# Patient Record
Sex: Male | Born: 1950 | Race: White | Hispanic: No | Marital: Married | Smoking: Current every day smoker
Health system: Southern US, Community
[De-identification: ages and names within clinical notes are randomized; demographics above are authoritative.]

## PROBLEM LIST (undated history)

## (undated) DIAGNOSIS — I10 Essential (primary) hypertension: Secondary | ICD-10-CM

## (undated) DIAGNOSIS — E7800 Pure hypercholesterolemia, unspecified: Secondary | ICD-10-CM

## (undated) DIAGNOSIS — R0600 Dyspnea, unspecified: Secondary | ICD-10-CM

## (undated) DIAGNOSIS — C4359 Malignant melanoma of other part of trunk: Secondary | ICD-10-CM

## (undated) DIAGNOSIS — R0609 Other forms of dyspnea: Secondary | ICD-10-CM

## (undated) DIAGNOSIS — E039 Hypothyroidism, unspecified: Secondary | ICD-10-CM

## (undated) DIAGNOSIS — N189 Chronic kidney disease, unspecified: Secondary | ICD-10-CM

## (undated) DIAGNOSIS — R112 Nausea with vomiting, unspecified: Secondary | ICD-10-CM

## (undated) DIAGNOSIS — Z9889 Other specified postprocedural states: Secondary | ICD-10-CM

## (undated) HISTORY — DX: Dyspnea, unspecified: R06.00

## (undated) HISTORY — DX: Other forms of dyspnea: R06.09

## (undated) HISTORY — PX: CERVICAL DISC SURGERY: SHX588

## (undated) HISTORY — DX: Essential (primary) hypertension: I10

## (undated) HISTORY — DX: Pure hypercholesterolemia, unspecified: E78.00

---

## 2001-10-29 ENCOUNTER — Emergency Department (HOSPITAL_COMMUNITY): Admission: EM | Admit: 2001-10-29 | Discharge: 2001-10-29 | Payer: Self-pay | Admitting: Emergency Medicine

## 2007-10-04 ENCOUNTER — Ambulatory Visit: Payer: Self-pay | Admitting: Vascular Surgery

## 2009-05-24 ENCOUNTER — Ambulatory Visit: Payer: Self-pay | Admitting: Vascular Surgery

## 2009-08-29 ENCOUNTER — Ambulatory Visit: Payer: Self-pay | Admitting: Vascular Surgery

## 2010-05-02 ENCOUNTER — Encounter: Admission: RE | Admit: 2010-05-02 | Discharge: 2010-05-02 | Payer: Self-pay | Admitting: Neurosurgery

## 2010-05-02 IMAGING — CT CT CERVICAL SPINE W/O CM
4 series · 16 of 33 positions shown, 19 images · non-contrast
Comparison: None

CLINICAL DATA: Neck pain.  Right arm pain and numbness.  Prior
cervical surgery.

CT CERVICAL SPINE WITHOUT CONTRAST
TECHNIQUE: Multidetector CT imaging of the cervical spine was
performed. Multiplanar CT image reconstructions were also
generated.

[Series 2: c spine bone · axial · 0.27mm/px · z∈[-59,+81]mm · 5 of 84 slices shown, 7 images]
[im 14/84  soft-tissue]
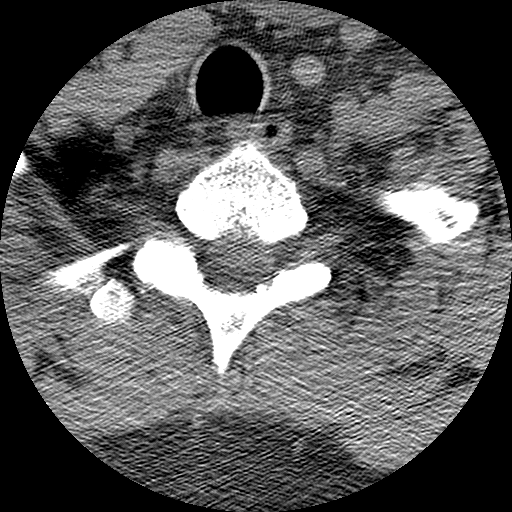
[im 14/84  bone]
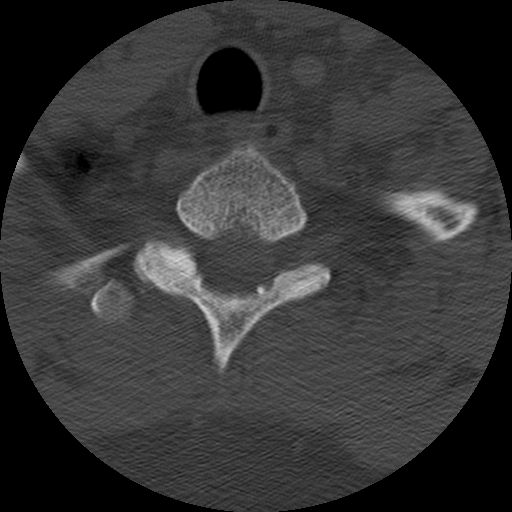
[im 28/84  bone]
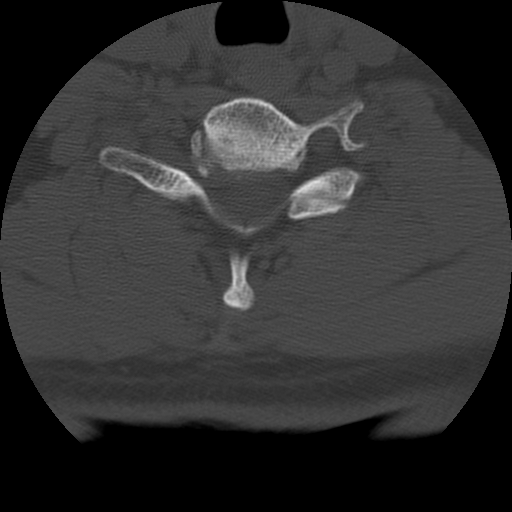
[im 42/84  bone]
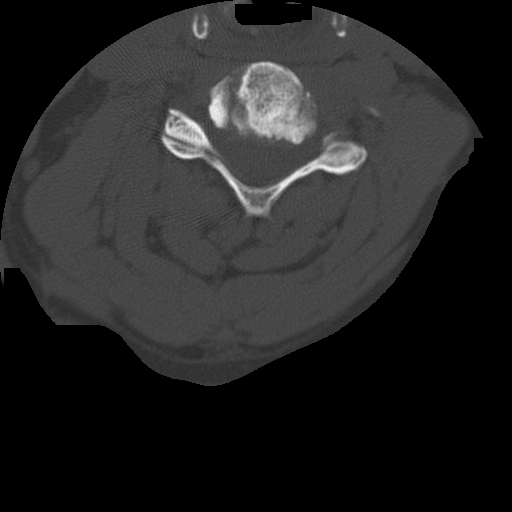
[im 56/84  bone]
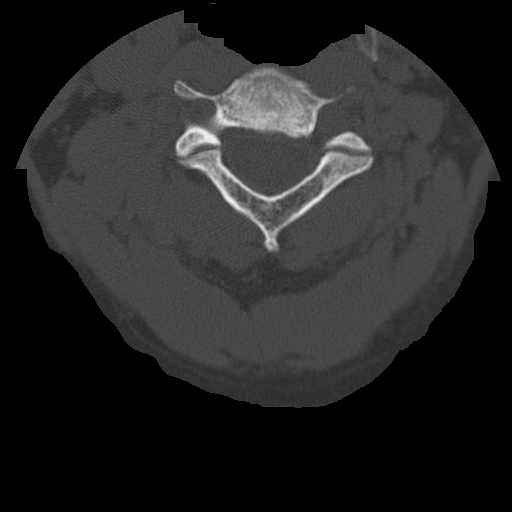
[im 70/84  soft-tissue]
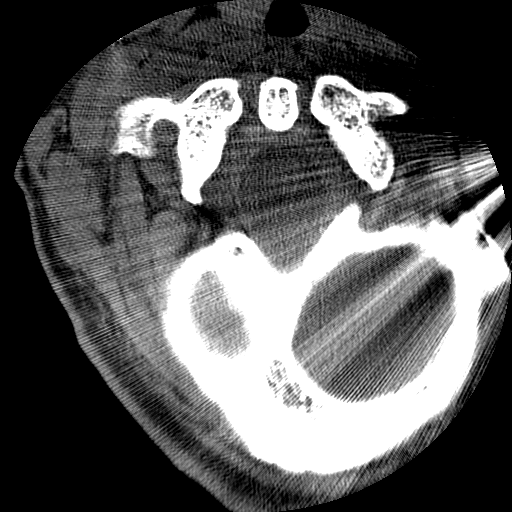
[im 70/84  bone]
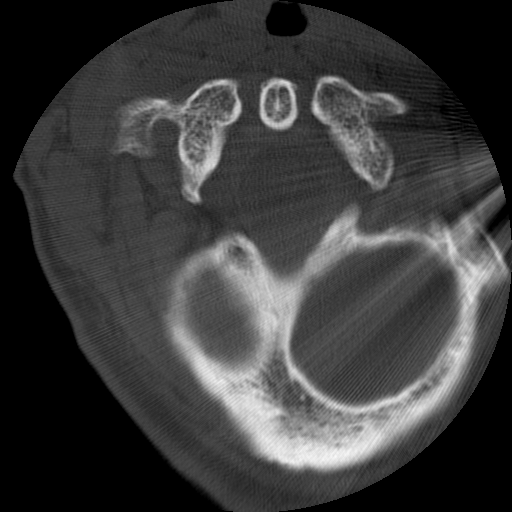

[Series 3: c spine soft · axial · 0.27mm/px · z∈[-59,+11]mm · 3 of 84 slices shown]
[im 14/84  soft-tissue]
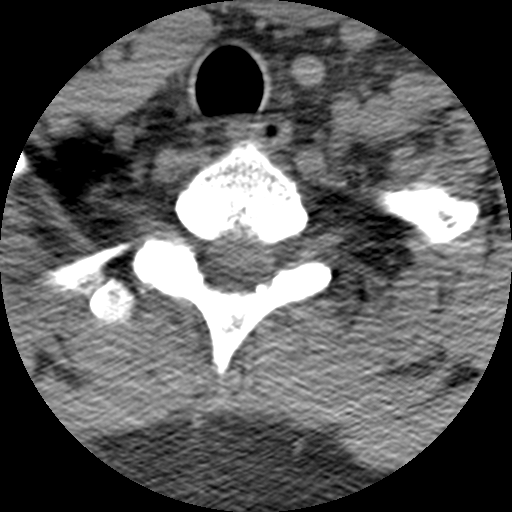
[im 28/84  soft-tissue]
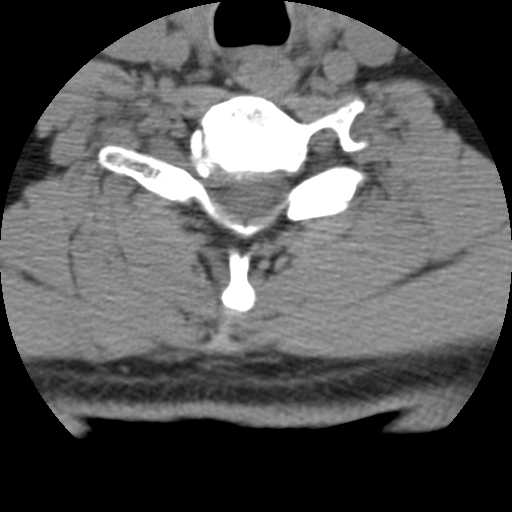
[im 42/84  soft-tissue]
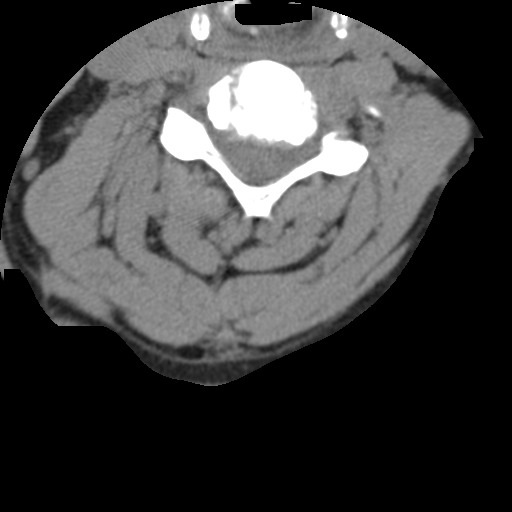

[Series 400: coronal · coronal · 0.39mm/px · 3 of 50 slices shown]
[im 10/50  bone]
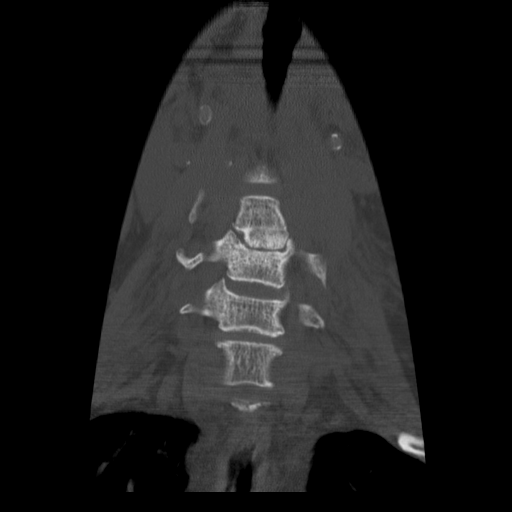
[im 20/50  bone]
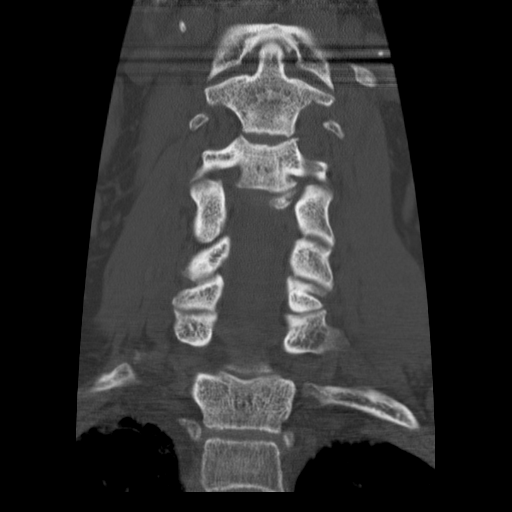
[im 30/50  bone]
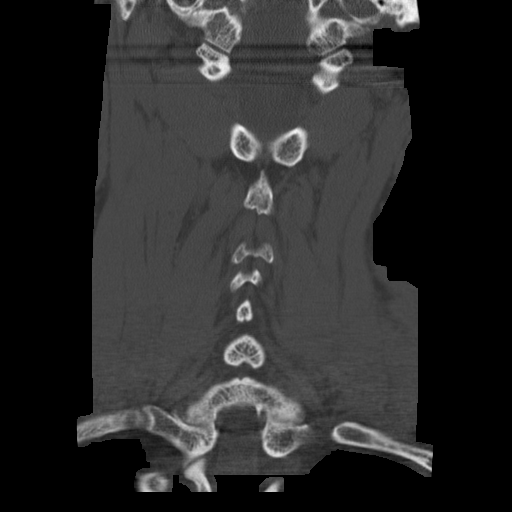

[Series 401: sagittal · sagittal · 0.39mm/px · 5 of 50 slices shown, 6 images]
[im 17/50  bone]
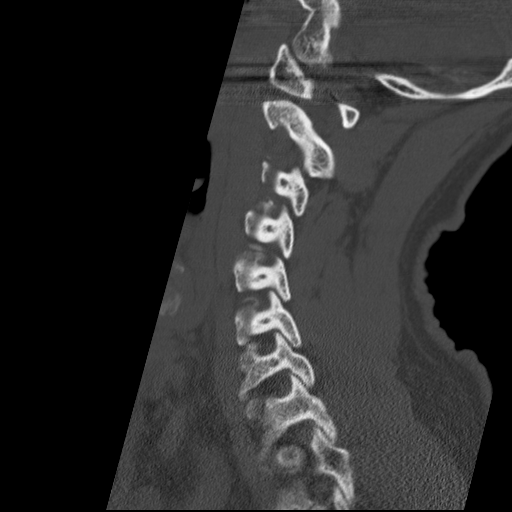
[im 21/50  bone]
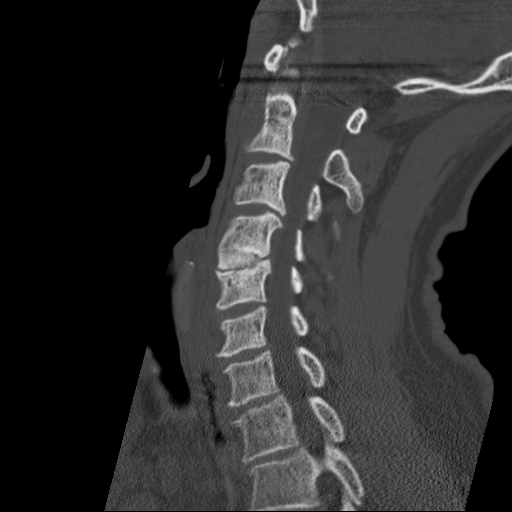
[im 25/50  soft-tissue]
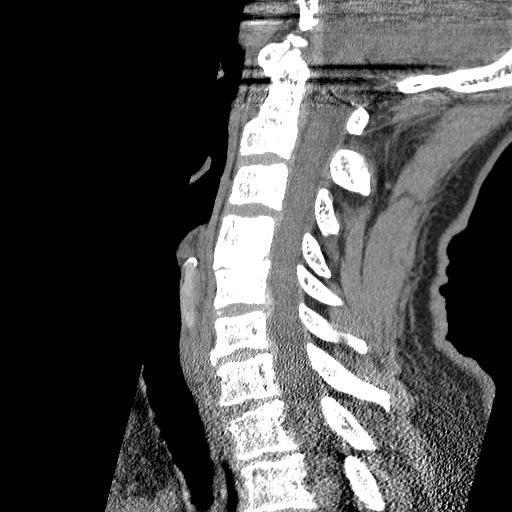
[im 25/50  bone]
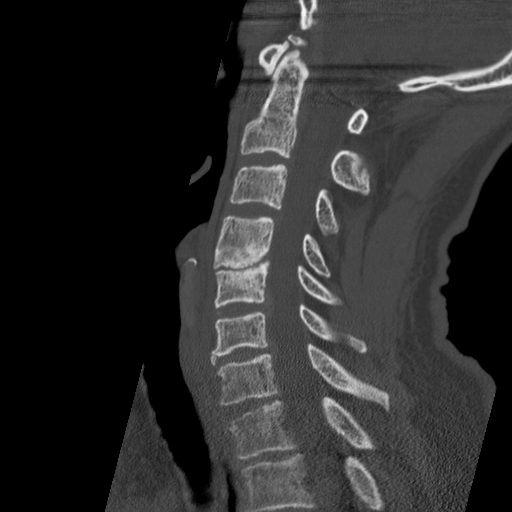
[im 29/50  bone]
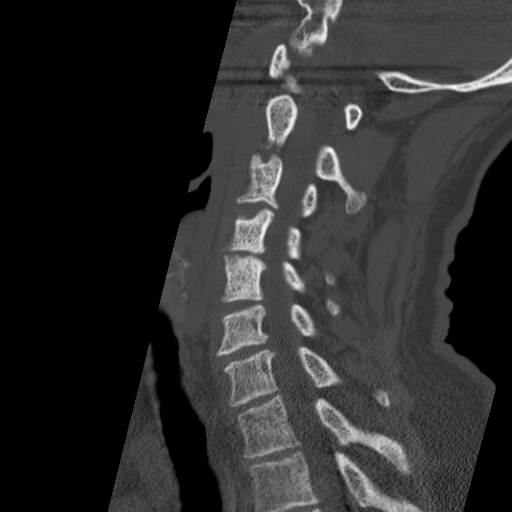
[im 33/50  bone]
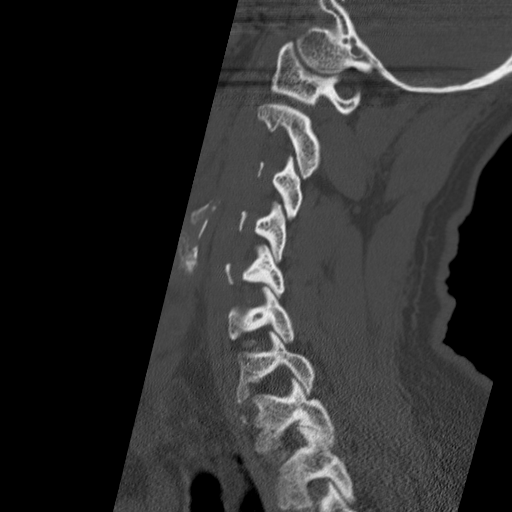

[16 of 33 positions shown; findings below may reference images not displayed]

FINDINGS: Prior attempted   anterior fusion at C4-5 without
hardware.  Interbody bone graft is in place with pseudoarthrosis
through the disc space.  Negative for fracture or mass.  No acute
bony abnormality.

C2-3:  Mild disc degeneration and spurring on the left with mild
left foraminal encroachment.

C3-4:  Disc degeneration and spondylosis.  Uncovertebral spurring,
left greater than right causing moderate left foraminal
encroachment and mild right foraminal encroachment.  Mild spinal
stenosis due to spurring.

C4-5:  Attempted interbody fusion with pseudoarthrosis.  There is
uncovertebral spurring   contributing to mild to moderate foraminal
encroachment bilaterally and mild spinal stenosis.

C5-6:  Central disc protrusion.  Mild uncovertebral spurring with
mild foraminal narrowing bilaterally.

C6-7:  Small central disc protrusion.  Disc degeneration and
spondylosis with spurring, right greater than left.  There is right
foraminal encroachment.

C7-T1:  Negative
IMPRESSION: Prior attempted fusion at C4-5 with pseudoarthrosis.  There is
spondylitic change at this level causing mild spinal stenosis and
foraminal encroachment, left greater than right.

Mild left foraminal narrowing at C2-3 and moderate left foraminal
narrowing at C3-4 due to spurring.

Central disc protrusion and spinal stenosis at C5-6 with associated
spondylosis.

Right foraminal narrowing at C6-7 due to spondylosis.

## 2010-07-17 LAB — CBC
HCT: 40.1 % (ref 39.0–52.0)
Hemoglobin: 13.2 g/dL (ref 13.0–17.0)
MCH: 32.5 pg (ref 26.0–34.0)
MCHC: 32.9 g/dL (ref 30.0–36.0)
MCV: 98.8 fL (ref 78.0–100.0)
Platelets: 202 10*3/uL (ref 150–400)
RBC: 4.06 MIL/uL — ABNORMAL LOW (ref 4.22–5.81)
RDW: 15.1 % (ref 11.5–15.5)
WBC: 5 10*3/uL (ref 4.0–10.5)

## 2010-07-24 ENCOUNTER — Ambulatory Visit (HOSPITAL_COMMUNITY)
Admission: RE | Admit: 2010-07-24 | Discharge: 2010-07-25 | Payer: Self-pay | Source: Home / Self Care | Attending: Neurosurgery | Admitting: Neurosurgery

## 2010-08-07 LAB — SURGICAL PCR SCREEN
MRSA, PCR: NEGATIVE
Staphylococcus aureus: NEGATIVE

## 2010-11-25 NOTE — Procedures (Signed)
DUPLEX DEEP VENOUS EXAM - LOWER EXTREMITY   INDICATION:  Follow up known thrombus.   HISTORY:  Edema:  Yes.  Trauma/Surgery:  No.  Pain:  Yes, right leg.  PE:  No.  Previous DVT:  Right great saphenous vein.  Anticoagulants:  Yes.  Other:  No.   DUPLEX EXAM:                CFV   SFV   PopV  PTV    GSV                R  L  R  L  R  L  R   L  R  L  Thrombosis    o  o  o     o     o      +  Spontaneous   +  +  +     +     +      0  Phasic        +  +  +     +     +      0  Augmentation  +  +  +     +     +      0  Compressible  +  +  +     +     +      0  Competent     +  +  +     +     +      0   Legend:  + - yes  o - no  p - partial  D - decreased   IMPRESSION:  1. The right leg appears free of deep venous thrombus.  2. 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  There is no evidence of      thrombus at the saphenofemoral junction.    _____________________________  Di Kindle. Edilia Bo, M.D.   CB/MEDQ  D:  08/30/2009  T:  08/30/2009  Job:  [id number]

## 2010-11-25 NOTE — Procedures (Signed)
DUPLEX DEEP VENOUS EXAM - LOWER EXTREMITY   INDICATION:  Right thigh pain for several months.   HISTORY:  Edema:  Right calf for 6 months which is intermittent.  Trauma/Surgery:  No.  Pain:  Right thigh.  PE:  No.  Previous DVT:  No.  Patient has a history of right greater saphenous  vein thrombosis in 2007.  Anticoagulants:  No.  Other:   DUPLEX EXAM:                CFV   SFV   PopV  PTV    GSV                R  L  R  L  R  L  R   L  R  L  Thrombosis    0  0  0     0     0      0  Spontaneous   +  +  +     +     +      +  Phasic        +  +  +     +     +      +  Augmentation  +  +  +     +     +      +  Compressible  +  +  +     +     +      +  Competent     +  +  +     +     +      0   Legend:  + - yes  o - no  p - partial  D - decreased   IMPRESSION:  1. No evidence of right leg DVT.  2. Right greater saphenous vein is patent but incompetent, which is      unchanged from 2007.  3. Right posterior tibial artery signal was within normal limits.    _____________________________  Di Kindle. Edilia Bo, M.D.   DP/MEDQ  D:  10/04/2007  T:  10/04/2007  Job:  [id number]

## 2010-11-25 NOTE — Assessment & Plan Note (Signed)
OFFICE VISIT   Bradley [name], Bradley [name]  DOB:  [birth year]                                       05/24/2009  NWGNF#:[id number]   Patient presents today for evaluation of leg pain.  He in the past had  seen Dr. Cari Caraway for superficial thrombophlebitis from his knee  distally.  He had then subsequently undergone noninvasive study in our  office in March 2009 showing resolution of the clot in the saphenous  vein but that he did have incompetence in the saphenous vein.   He presents today with complaints of pain over the past 2 weeks and  initially some erythema in his right groin, which is resolved.  He does  have some chronic swelling in his lower extremities.  Health is  otherwise quite good with no major medical difficulties.   He underwent noninvasive vascular laboratory studies in our office, and  this did not show any evidence of deep venous thrombosis.  He did have a  clot in his saphenous vein up to the saphenofemoral junction.  I  discussed this at length with patient.  I explained that with a  propagation of clot that I would recommend Coumadin therapy.  I  discussed this with Dr. Jeanmarie Hubert as well, who will manage the  Coumadin.  We did discuss the option of subcu Lovenox but with him  having no DVT, I feel that it is appropriate to start him on Coumadin  without a Lovenox bridge.  Plan to see him again in 3 months with repeat  duplex at that time   Larina Earthly, M.D.  Electronically Signed   TFE/MEDQ  D:  05/24/2009  T:  05/27/2009  Job:  [id number]

## 2010-11-25 NOTE — Assessment & Plan Note (Signed)
OFFICE VISIT   Bradley, [name]  DOB:  18-Feb-1951                                       10/04/2007  NGEXB#:[id number]   HISTORY:  I saw the patient today in the office today concerning some  pain in his right thigh.  He has a history of superficial  thrombophlebitis.  This is a pleasant 60 year old gentleman who I had  seen back in August of 2003.  Over the last 4-5 months he has had some  pain in his right thigh and he was concerned he might have a blood clot.  He does have a history of superficial thrombophlebitis in this area but  no history of DVT.  He states that standing aggravates his symptoms  somewhat.  Elevation relieves his symptoms somewhat.  Over the last 4-5  months his symptoms have remained relatively stable.  His only other  complaint has been some occasional pain in his calf with ambulation and  also some swelling in his right ankle.  He has had no history of rest  pain, no history of nonhealing ulcers and no symptoms in the left leg.   PAST MEDICAL HISTORY:  His past medical history is fairly unremarkable.  He denies any history of diabetes, hypertension, hypercholesterolemia,  history of previous myocardial infarction or history of congestive heart  failure.   FAMILY HISTORY:  There is no history of premature cardiovascular  disease.   SOCIAL HISTORY:  He is married and he has 2 children.  He smokes a pack  per day of cigarettes and has been smoking for 40 years.   REVIEW OF SYSTEMS:  Documented on the medical history form in his chart.   MEDICATIONS:  He is currently on no  medications.   PHYSICAL EXAMINATION:  On physical examination this is a pleasant 24-  year-old gentleman who appears his stated age.  Blood pressure is  126/85, heart rate 71.  I do not detect any carotid bruits.  Lungs are  clear bilaterally to auscultation.  On cardiac exam he has a regular  rate and rhythm.  Abdomen is soft and nontender.  He has palpable  femoral, popliteal and pedis pulses bilaterally.  He has some  hyperpigmentation bilaterally and some small varicose veins.  He has no  evidence of active phlebitis on exam and no venous ulcers or ischemic  ulcers.   He did have a venous duplex scan in our office today which shows no  evidence of DVT on the right side and no evidence of superficial  thrombophlebitis.  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   I reassured him he had no evidence of DVT.  We have discussed the  importance of intermittent leg elevation.  He is not an ideal candidate  for compression stockings given the location of his symptoms in the  thigh and he is fairly active and does not want to wear compression  stockings.  I will be happy to see him back if any new vascular issues  arise.  He understands this is a chronic problem but not serious.   Di Kindle. Edilia Bo, M.D.  Electronically Signed   CSD/MEDQ  D:  10/04/2007  T:  10/05/2007  Job:  [id number]

## 2010-11-25 NOTE — Procedures (Signed)
DUPLEX DEEP VENOUS EXAM - LOWER EXTREMITY   INDICATION:  Right lower extremity began approximately 2 weeks ago, has  improved some.   HISTORY:  Edema:  Possible minimal right lower extremity.  Trauma/Surgery:  Pain:  Right lower extremity.  PE:  No.  Previous DVT:  Superficial right greater saphenous vein thrombus.  Anticoagulants:  No.  Other:   DUPLEX EXAM:                CFV   SFV   PopV  PTV    GSV                R  L  R  L  R  L  R   L  R  L  Thrombosis    +  0  0     0     0      +  Spontaneous   +  +  +     +     +      0  Phasic        +  +  +     +     +      0  Augmentation  +  +  +     +     +      0  Compressible  P  +  +     +     +      0  Competent     +  +  +     +     +      0   Legend:  + - yes  o - no  p - partial  D - decreased   IMPRESSION:  1. No evidence of deep venous thrombosis in the right lower extremity      or left common femoral vein except for tail extending into common      femoral vein.  2. However, a totally occluded right greater saphenous vein with      thrombus extending in small tail (nonmobile) into the common      femoral vein.  3. Thrombosed varicosities noted in knee/calf area.    _____________________________  Di Kindle. Edilia Bo, M.D.   AS/MEDQ  D:  05/24/2009  T:  05/25/2009  Job:  [id number]

## 2013-11-29 ENCOUNTER — Telehealth: Payer: Self-pay

## 2013-11-29 DIAGNOSIS — I8390 Asymptomatic varicose veins of unspecified lower extremity: Secondary | ICD-10-CM

## 2013-11-29 DIAGNOSIS — M7989 Other specified soft tissue disorders: Secondary | ICD-10-CM

## 2013-11-29 DIAGNOSIS — I872 Venous insufficiency (chronic) (peripheral): Secondary | ICD-10-CM

## 2013-11-29 NOTE — Telephone Encounter (Addendum)
Phone call from pt.  Reported has had swelling in the right ankle with discoloration for approx. 6-8 mos.  Stated the right ankle is significantly more swollen compared to the left ankle.  Also reported has a large, palm-sized area of inner right leg, just above the ankle, that he has had a long time;  Reported this area has become more red, scaly, and itchy.  Denies any open sores/ drainage at this time.  Questioned about pain.  Pt. stated he has had intermittent aching in the right leg for several years; stated "I've grown accustomed to the throbbing sensation that I get in the right leg."  Denies rest pain. Pt. also reports bulging varicosities of post. right thigh; denies any pain in varicosities.  Stated he called today, due to wife becoming aware of the swelling, and encouraged him to call.  Discussed with Dr. Scot Dock.  Recommended to sched. a (R) LE Venous Reflux Study, and office exam.  Pt. will be notified of appts.

## 2013-11-30 NOTE — Telephone Encounter (Signed)
Spoke with patient to schedule. dpm

## 2013-12-26 ENCOUNTER — Encounter: Payer: Self-pay | Admitting: Vascular Surgery

## 2013-12-27 ENCOUNTER — Ambulatory Visit (INDEPENDENT_AMBULATORY_CARE_PROVIDER_SITE_OTHER): Payer: 59 | Admitting: Vascular Surgery

## 2013-12-27 ENCOUNTER — Encounter: Payer: Self-pay | Admitting: Vascular Surgery

## 2013-12-27 ENCOUNTER — Ambulatory Visit (HOSPITAL_COMMUNITY)
Admission: RE | Admit: 2013-12-27 | Discharge: 2013-12-27 | Disposition: A | Discharge status: Home / Self Care | Payer: 59 | Source: Ambulatory Visit | Attending: Vascular Surgery | Admitting: Vascular Surgery

## 2013-12-27 VITALS — BP 129/92 | HR 61 | Ht 72.0 in | Wt 237.0 lb

## 2013-12-27 DIAGNOSIS — I83893 Varicose veins of bilateral lower extremities with other complications: Secondary | ICD-10-CM | POA: Insufficient documentation

## 2013-12-27 DIAGNOSIS — I8390 Asymptomatic varicose veins of unspecified lower extremity: Secondary | ICD-10-CM | POA: Insufficient documentation

## 2013-12-27 DIAGNOSIS — M7989 Other specified soft tissue disorders: Secondary | ICD-10-CM | POA: Insufficient documentation

## 2013-12-27 DIAGNOSIS — I8310 Varicose veins of unspecified lower extremity with inflammation: Secondary | ICD-10-CM

## 2013-12-27 DIAGNOSIS — I872 Venous insufficiency (chronic) (peripheral): Secondary | ICD-10-CM | POA: Insufficient documentation

## 2013-12-27 NOTE — Assessment & Plan Note (Signed)
This patient has chronic venous insufficiency with reflux both in his deep system in the right lower extremity and also in the right greater saphenous vein. He has a rash along his medial malleolus but no open ulcer. We have discussed the importance of intermittent leg elevation and compression therapy. I've encouraged him to avoid prolonged sitting and standing. We discussed potentially doing water aerobics which I think is helpful for patients with venous disease. We also discussed considering a thigh high compression stocking with a 20-30 mm of mercury pressure gradient and if this was not successful been considering laser ablation of his right greater saphenous vein. Currently he is not interested in that option. We've also discussed the importance of tobacco cessation. Also him back as needed. I have written him a prescription for knee-high compression stockings with a gradient of 15-20 mm mercury.

## 2013-12-27 NOTE — Progress Notes (Signed)
Patient ID: Bradley [name], male   DOB: Jan 28, 1951, 63 y.o.   MRN: [id number]  Reason for Consult: New Evaluation   Referred by No ref. Majesta Leichter found  Subjective:     HPI:  Bradley [name] is a 62 y.o. male Has a history of a superficial clot in his right greater saphenous vein back in 2011. This was treated with Coumadin for approximately 3 months. He was then lost to follow up. He had no evidence of DVT at that time. He developed a rash along the medial aspect of his distal right leg and presents for venous evaluation. He denies any open ulcers. He does experience some pain and aching when he is standing for prolonged time. 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 He denies any bleeding episodes. He has had some leg swelling.  He smokes a pack per day of cigarettes for as long as he can remember.  History reviewed. No pertinent past medical history. Family History  Problem Relation Age of Onset  . Heart disease Father   . Diabetes Father   . Hyperlipidemia Father    Past Surgical History  Procedure Laterality Date  . Cervical disc surgery     Short Social History:  History  Substance Use Topics  . Smoking status: Current Every Day Smoker -- 1.00 packs/day for 50 years    Types: Cigarettes  . Smokeless tobacco: Not on file  . Alcohol Use: 10.2 - 12.6 oz/week    12-15 Cans of beer, 5-6 Shots of liquor per week   No Known Allergies  Current Outpatient Prescriptions  Medication Sig Dispense Refill  . atorvastatin (LIPITOR) 10 MG tablet Take 1 tablet by mouth daily.       No current facility-administered medications for this visit.    Review of Systems  Constitutional: Negative for chills and fever.  Eyes: Negative for loss of vision.  Respiratory: Negative for cough and wheezing.  Cardiovascular: Positive for leg swelling. Negative for chest pain, chest tightness, claudication, dyspnea with exertion, orthopnea and palpitations.  GI: Negative for blood in  stool and vomiting.  GU: Negative for dysuria and hematuria.  Musculoskeletal: Negative for leg pain, joint pain and myalgias.  Skin: Positive for rash. Negative for wound.  Neurological: Positive for numbness. Negative for dizziness and speech difficulty.  Hematologic: Negative for bruises/bleeds easily. Psychiatric: Negative for depressed mood.        Objective:  Objective  Filed Vitals:   12/27/13 0951  BP: 129/92  Pulse: 61  Height: 6' (1.829 m)  Weight: 237 lb (107.502 kg)  SpO2: 95%   Body mass index is 32.14 kg/(m^2).  Physical Exam  Constitutional: He is oriented to person, place, and time. He appears well-developed and well-nourished.  HENT:  Head: Normocephalic and atraumatic.  Neck: Neck supple. No JVD present. No thyromegaly present.  Cardiovascular: Normal rate, regular rhythm and normal heart sounds.  Exam reveals no friction rub.   No murmur heard. Pulses:      Dorsalis pedis pulses are 2+ on the right side, and 2+ on the left side.  Pulmonary/Chest: Breath sounds normal. He has no wheezes. He has no rales.  Abdominal: Soft. Bowel sounds are normal. There is no tenderness.  I do not palpate an aneurysm.  Musculoskeletal: Normal range of motion. He exhibits no edema.  Lymphadenopathy:    He has no cervical adenopathy.  Neurological: He is alert and oriented to person, place, and time. He  has normal strength. No sensory deficit.  Skin: Rash noted. No lesion noted.  Psychiatric: He has a normal mood and affect.   Data: I have independently interpreted his venous duplex scan today. There is no evidence of DVT. He does have reflux in his deep system on the right and also in the right greater saphenous vein.      Assessment/Plan:    Varicose veins of lower extremities with other complications This patient has chronic venous insufficiency with reflux both in his deep system in the right lower extremity and also in the right greater saphenous vein. He has a rash  along his medial malleolus but no open ulcer. We have discussed the importance of intermittent leg elevation and compression therapy. I've encouraged him to avoid prolonged sitting and standing. We discussed potentially doing water aerobics which I think is helpful for patients with venous disease. We also discussed considering a thigh high compression stocking with a 20-30 mm of mercury pressure gradient and if this was not successful been considering laser ablation of his right greater saphenous vein. Currently he is not interested in that option. We've also discussed the importance of tobacco cessation. Also him back as needed. I have written him a prescription for knee-high compression stockings with a gradient of 15-20 mm mercury.     Angelia Mould MD [facility]

## 2016-05-29 ENCOUNTER — Other Ambulatory Visit: Payer: Self-pay | Admitting: Family Medicine

## 2016-05-29 DIAGNOSIS — N289 Disorder of kidney and ureter, unspecified: Secondary | ICD-10-CM

## 2016-06-02 ENCOUNTER — Ambulatory Visit
Admission: RE | Admit: 2016-06-02 | Discharge: 2016-06-02 | Disposition: A | Discharge status: Home / Self Care | Payer: Commercial Managed Care - HMO | Source: Ambulatory Visit | Attending: Family Medicine | Admitting: Family Medicine

## 2016-06-02 DIAGNOSIS — N289 Disorder of kidney and ureter, unspecified: Secondary | ICD-10-CM

## 2017-07-20 NOTE — Progress Notes (Signed)
Cardiology Office Note   Date:  07/23/2017   ID:  Rigel, Filsinger 1950/10/04, MRN [id number]  PCP:  Gaynelle Arabian, MD  Cardiologist:   Jenkins Rouge, MD   No chief complaint on file.     History of Present Illness: Bradley [name] is a 67 y.o. male who presents for consultation regarding HTN, HLD and dyspnea. Referred by Dr Marisue Humble And nephrologist Dr Hollie Salk  He is a long time smoker 1 ppd over 40 years . 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 History of superficial venous thrombosis right greater Saphenous vein in 2011 Rx with coumadin for 3 months seen by Dr Doren Custard [facility]. He was not interested in laser ablation of the rgith greater saphenous vein. 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 Dyspnea on exertion   . Hypercholesteremia   . Hypertension     Past Surgical History:  Procedure Laterality Date  . CERVICAL DISC SURGERY       Current Outpatient Medications  Medication Sig Dispense Refill  . atorvastatin (LIPITOR) 10 MG tablet Take 1 tablet by mouth daily.    . chlorthalidone (HYGROTON) 25 MG tablet Take 1 tablet by mouth daily.     No current facility-administered medications for this visit.     Allergies:   Patient has no known allergies.    Social History:  The patient  reports that he has been smoking cigarettes.  He has a 50.00 pack-year smoking history. he has never used smokeless tobacco. He reports that he drinks about 10.2 - 12.6 oz of alcohol per week. He reports that he does not use drugs.   Family History:  The patient's family history includes Diabetes in his father; Heart disease in his father; Hyperlipidemia in his father.    ROS:  Please see  the history of present illness.   Otherwise, review of systems are positive for none.   All other systems are reviewed and negative.    PHYSICAL EXAM: VS:  BP (!) 142/84   Pulse 77   Ht 6' (1.829 m)   Wt 230 lb 4 oz (104.4 kg)   SpO2 99%   BMI 31.23 kg/m  , BMI Body mass index is 31.23 kg/m. Affect appropriate Healthy:  appears stated age 54: normal Neck supple with no adenopathy JVP normal no bruits no thyromegaly Lungs clear with no wheezing and good diaphragmatic motion Heart:  S1/S2 no murmur, no rub, gallop or click PMI normal Abdomen: benighn, BS positve, no tenderness, no AAA no bruit.  No HSM or HJR Distal pulses intact with no bruits RLE varicosities  Neuro non-focal Skin warm and dry No muscular weakness    EKG:  NSR rate 67 LAD low voltage    Recent Labs: No results found for requested labs within last 8760 hours.    Lipid Panel No results found for: CHOL, TRIG, HDL, CHOLHDL, VLDL, LDLCALC, LDLDIRECT    Wt Readings from Last 3 Encounters:  07/23/17 230 lb 4 oz (104.4 kg)  12/27/13 237 lb (107.5 kg)      Other studies Reviewed: Additional studies/ records that were reviewed today include: Notes Dr Marisue Humble ,  labs , venous duplex 2011 and renal US 06/2017.    ASSESSMENT AND PLAN:  1. Dyspnea likely from smoking will order lung cancer screening CT, echo and exercise myovue to assess EF And r/o CAD as well 2. Smoking counseled on cessation CT chest  3. HTN Well controlled.  Continue current medications and low sodium Dash type diet.   4. CRF Baseline Cr 2.0 f/u nephrology  5. HLD continue statin labs with primary  6. PVD/Venous Dx compression stockings elevate legs as needed continue diuretic    Current medicines are reviewed at length with the patient today.  The patient does not have concerns regarding medicines.  The following changes have been made:  no change  Labs/ tests ordered today include: Myovue, Chest CT, Echo  No orders of the  defined types were placed in this encounter.    Disposition:   FU with cardiology PRN if tests normal      Signed, Jenkins Rouge, MD  07/23/2017 10:17 AM    [facility] [place], [place], [place]  [id number] Phone: [phone]; Fax: [phone]

## 2017-07-23 ENCOUNTER — Ambulatory Visit: Payer: Medicare Other | Admitting: Cardiovascular Disease

## 2017-07-23 ENCOUNTER — Encounter: Payer: Self-pay | Admitting: Cardiovascular Disease

## 2017-07-23 VITALS — BP 142/84 | HR 77 | Ht 72.0 in | Wt 230.2 lb

## 2017-07-23 DIAGNOSIS — R079 Chest pain, unspecified: Secondary | ICD-10-CM

## 2017-07-23 DIAGNOSIS — I10 Essential (primary) hypertension: Secondary | ICD-10-CM | POA: Diagnosis not present

## 2017-07-23 DIAGNOSIS — E7800 Pure hypercholesterolemia, unspecified: Secondary | ICD-10-CM

## 2017-07-23 DIAGNOSIS — R0609 Other forms of dyspnea: Secondary | ICD-10-CM

## 2017-07-23 DIAGNOSIS — I739 Peripheral vascular disease, unspecified: Secondary | ICD-10-CM

## 2017-07-23 DIAGNOSIS — Z720 Tobacco use: Secondary | ICD-10-CM

## 2017-07-23 NOTE — Patient Instructions (Addendum)
Medication Instructions:  Your physician recommends that you continue on your current medications as directed. Please refer to the Current Medication list given to you today.  Labwork: NONE  Testing/Procedures: Your physician has requested that you have an echocardiogram. Echocardiography is a painless test that uses sound waves to create images of your heart. It provides your doctor with information about the size and shape of your heart and how well your heart's chambers and valves are working. This procedure takes approximately one hour. There are no restrictions for this procedure.  Your physician has requested that you have en exercise stress myoview. For further information please visit [email or web address]. Please follow instruction sheet, as given.   CT scanning for Cancer Screening , (CAT scanning), is a noninvasive, special x-ray that produces cross-sectional images of the body using x-rays and a computer. CT scans help physicians diagnose and treat medical conditions. For some CT exams, a contrast material is used to enhance visibility in the area of the body being studied. CT scans provide greater clarity and reveal more details than regular x-ray exams.  Follow-Up: Your physician wants you to follow-up in: 6 months with Dr. Johnsie Cancel. You will receive a reminder letter in the mail two months in advance. If you don't receive a letter, please call our office to schedule the follow-up appointment.   If you need a refill on your cardiac medications before your next appointment, please call your pharmacy.

## 2017-07-27 ENCOUNTER — Telehealth (HOSPITAL_COMMUNITY): Payer: Self-pay | Admitting: *Deleted

## 2017-07-27 NOTE — Telephone Encounter (Signed)
Patient given detailed instructions per Myocardial Perfusion Study Information Sheet for the test on 07/30/17 at 0715. Patient notified to arrive 15 minutes early and that it is imperative to arrive on time for appointment to keep from having the test rescheduled.  If you need to cancel or reschedule your appointment, please call the office within 24 hours of your appointment. . Patient verbalized understanding.Matalie Romberger, Ranae Palms

## 2017-07-30 ENCOUNTER — Ambulatory Visit (HOSPITAL_COMMUNITY): Payer: Medicare Other | Attending: Cardiology

## 2017-07-30 ENCOUNTER — Ambulatory Visit (HOSPITAL_BASED_OUTPATIENT_CLINIC_OR_DEPARTMENT_OTHER): Payer: Medicare Other

## 2017-07-30 ENCOUNTER — Ambulatory Visit (INDEPENDENT_AMBULATORY_CARE_PROVIDER_SITE_OTHER)
Admission: RE | Admit: 2017-07-30 | Discharge: 2017-07-30 | Disposition: A | Discharge status: Home / Self Care | Payer: Medicare Other | Source: Ambulatory Visit | Attending: Cardiovascular Disease | Admitting: Cardiovascular Disease

## 2017-07-30 ENCOUNTER — Other Ambulatory Visit: Payer: Self-pay

## 2017-07-30 DIAGNOSIS — I119 Hypertensive heart disease without heart failure: Secondary | ICD-10-CM | POA: Diagnosis not present

## 2017-07-30 DIAGNOSIS — R9439 Abnormal result of other cardiovascular function study: Secondary | ICD-10-CM | POA: Insufficient documentation

## 2017-07-30 DIAGNOSIS — I739 Peripheral vascular disease, unspecified: Secondary | ICD-10-CM | POA: Diagnosis not present

## 2017-07-30 DIAGNOSIS — E7800 Pure hypercholesterolemia, unspecified: Secondary | ICD-10-CM | POA: Insufficient documentation

## 2017-07-30 DIAGNOSIS — F17210 Nicotine dependence, cigarettes, uncomplicated: Secondary | ICD-10-CM | POA: Diagnosis not present

## 2017-07-30 DIAGNOSIS — I10 Essential (primary) hypertension: Secondary | ICD-10-CM

## 2017-07-30 DIAGNOSIS — Z720 Tobacco use: Secondary | ICD-10-CM

## 2017-07-30 DIAGNOSIS — Z87891 Personal history of nicotine dependence: Secondary | ICD-10-CM

## 2017-07-30 DIAGNOSIS — R079 Chest pain, unspecified: Secondary | ICD-10-CM | POA: Diagnosis not present

## 2017-07-30 DIAGNOSIS — Z8249 Family history of ischemic heart disease and other diseases of the circulatory system: Secondary | ICD-10-CM | POA: Diagnosis not present

## 2017-07-30 DIAGNOSIS — R0609 Other forms of dyspnea: Secondary | ICD-10-CM | POA: Diagnosis not present

## 2017-07-30 LAB — MYOCARDIAL PERFUSION IMAGING
LV dias vol: 97 mL (ref 62–150)
LV sys vol: 42 mL
Peak HR: 122 {beats}/min
RATE: 0.24
Rest HR: 55 {beats}/min
SDS: 3
SRS: 4
SSS: 7
TID: 0.96

## 2017-07-30 MED ORDER — TECHNETIUM TC 99M TETROFOSMIN IV KIT
32.1000 | PACK | Freq: Once | INTRAVENOUS | Status: AC | PRN
  Administered 2017-07-30: 32.1 via INTRAVENOUS
  Filled 2017-07-30: qty 33

## 2017-07-30 MED ORDER — TECHNETIUM TC 99M TETROFOSMIN IV KIT
10.5000 | PACK | Freq: Once | INTRAVENOUS | Status: AC | PRN
  Administered 2017-07-30: 10.5 via INTRAVENOUS
  Filled 2017-07-30: qty 11

## 2017-07-30 MED ORDER — REGADENOSON 0.4 MG/5ML IV SOLN
0.4000 mg | Freq: Once | INTRAVENOUS | Status: AC
  Administered 2017-07-30: 0.4 mg via INTRAVENOUS

## 2019-03-16 ENCOUNTER — Other Ambulatory Visit: Payer: Self-pay | Admitting: General Surgery

## 2019-03-16 DIAGNOSIS — C4361 Malignant melanoma of right upper limb, including shoulder: Secondary | ICD-10-CM

## 2019-03-17 ENCOUNTER — Other Ambulatory Visit: Payer: Self-pay | Admitting: General Surgery

## 2019-03-17 NOTE — H&P (Signed)
Bradley [name] Appointment: 03/17/2019 10:00 AM Location: [facility] Patient #: [id number] DOB: Oct 05, 1950 Married / Language: Bradley [name] / Race: White Male   History of Present Illness Bradley Klein MD; 03/17/2019 10:47 AM) The patient is a 68 year old male who presents with malignant melanoma. Pt is a 68 yo M referred for consultation by Dr. Wilhemina [name] regarding a new dx of malignant melanoma of the right upper chest 02/2019.   Shave biopsy was performed, demonstrating a 1.0 mm superficial spreading melanoma with negative deep margin, positive peripheral margin. There was no LVI, no ulceration, no satellitosis, no additional mitoses, no neurotropism. The tumor did have regression. It was at least clark's level 4 and a T1b. He is referred for wide excision and sentinel lymph node biopsy. Of note, he has had a small melanoma on his back and on his nose. He has also had multiple basal and squamous cell cancers. He denies other cancer history. He has a long history of sun exposure, but never burns.    Past Surgical History Bradley [name], [place]; 03/17/2019 9:51 AM) Vasectomy   Diagnostic Studies History Bradley [name], [place]; 03/17/2019 9:51 AM) Colonoscopy  5-10 years ago  Allergies Bradley [name], CMA; 03/17/2019 9:52 AM) No Known Drug Allergies  [03/17/2019]: Allergies Reconciled   Medication History Bradley [name], CMA; 03/17/2019 9:53 AM) Aspirin (81MG  Tablet, Oral) Active. Atorvastatin Calcium (10MG  Tablet, Oral) Active. Chlorthalidone (25MG  Tablet, Oral) Active. Levothyroxine Sodium (50MCG Tablet, Oral) Active. Vitamin B12 (500MCG Tablet, Oral) Active. Allopurinol (100MG  Tablet, Oral) Active. amLODIPine Besylate (5MG  Tablet, Oral) Active. Fish Oil (500MG  Capsule, Oral) Active. Medications Reconciled  Social History Bradley [name], [place]; 03/17/2019 9:51 AM) Alcohol use  Moderate alcohol use. Caffeine use  Coffee, Tea. Tobacco use  Current every day  smoker.  Family History Bradley [name], [place]; 03/17/2019 9:51 AM) Alcohol Abuse  Father, Mother. Arthritis  Mother. Breast Cancer  Mother. Heart Disease  Father. Hypertension  Father. Kidney Disease  Father.  Other Problems Bradley [name], CMA; 03/17/2019 9:51 AM) Back Pain  Chronic Renal Failure Syndrome  High blood pressure  Hypercholesterolemia     Review of Systems Bradley Klein MD; 03/17/2019 10:47 AM) General Present- Fatigue. Not Present- Appetite Loss, Chills, Fever, Night Sweats, Weight Gain and Weight Loss. HEENT Present- Ringing in the Ears. Not Present- Earache, Hearing Loss, Hoarseness, Nose Bleed, Oral Ulcers, Seasonal Allergies, Sinus Pain, Sore Throat, Visual Disturbances, Wears glasses/contact lenses and Yellow Eyes. All other systems negative  Vitals Bradley [name] CMA; 03/17/2019 9:52 AM) 03/17/2019 9:51 AM Weight: 220.2 lb Height: 72in Body Surface Area: 2.22 m Body Mass Index: 29.86 kg/m  Temp.: 97.49F  Pulse: 85 (Regular)  BP: 126/78(Sitting, Left Arm, Standard)       Physical Exam Bradley Klein MD; 03/17/2019 10:48 AM) General Mental Status-Alert. General Appearance-Consistent with stated age. Hydration-Well hydrated. Voice-Normal.  Integumentary Note: raw area on right hand where squamous cell cancer was removed. Raw 1 cm area on right upper chest wall around midpoint of clavicle. No residual pigmentation.   Head and Neck Head-normocephalic, atraumatic with no lesions or palpable masses. Trachea-midline. Thyroid Gland Characteristics - normal size and consistency.  Eye Eyeball - Bilateral-Extraocular movements intact. Sclera/Conjunctiva - Bilateral-No scleral icterus.  Chest and Lung Exam Chest and lung exam reveals -quiet, even and easy respiratory effort with no use of accessory muscles and on auscultation, normal breath sounds, no adventitious sounds and normal vocal resonance. Inspection Chest  Wall - Normal. Back - normal.  Cardiovascular Cardiovascular examination reveals -  normal heart sounds, regular rate and rhythm with no murmurs and normal pedal pulses bilaterally.  Abdomen Inspection Inspection of the abdomen reveals - No Hernias. Palpation/Percussion Palpation and Percussion of the abdomen reveal - Soft, Non Tender, No Rebound tenderness, No Rigidity (guarding) and No hepatosplenomegaly. Auscultation Auscultation of the abdomen reveals - Bowel sounds normal.  Neurologic Neurologic evaluation reveals -alert and oriented x 3 with no impairment of recent or remote memory. Mental Status-Normal.  Musculoskeletal Global Assessment -Note: no gross deformities.  Normal Exam - Left-Upper Extremity Strength Normal and Lower Extremity Strength Normal. Normal Exam - Right-Upper Extremity Strength Normal and Lower Extremity Strength Normal.  Lymphatic Head & Neck  General Head & Neck Lymphatics: Bilateral - Description - Normal. Axillary  General Axillary Region: Bilateral - Description - Normal. Tenderness - Non Tender. Femoral & Inguinal  Generalized Femoral & Inguinal Lymphatics: Bilateral - Description - No Generalized lymphadenopathy.    Assessment & Plan Bradley Klein MD; 03/17/2019 10:48 AM)  MALIGNANT MELANOMA OF CHEST WALL (C43.59) Impression: Patient has at least a T1b melanoma in the upper chest region. I discussed the rational for wide excision and the surgical techniques used. I discussed the concept of sentinel node.  I reviewed the procedure with the patient including risks. I discussed risks of bleeding, infection, damage to adjacent structures, permanent numbness, possible wound breakdown, possible need for additional procedures, possible recurrent cancer, possible heart or lung complications and possible death. I discussed recovery and post op expectations. his wife was present at the visit.  We will proceed at the first available  opportunity. I advised the patient that if his nodes were positive, we would get a PET scan and refer to oncology.  Current Plans You are being scheduled for surgery- Our schedulers will call you.  You should hear from our office's scheduling department within 5 working days about the location, date, and time of surgery. We try to make accommodations for patient's preferences in scheduling surgery, but sometimes the OR schedule or the surgeon's schedule prevents [place] from making those accommodations.  If you have not heard from our office [phone]) in 5 working days, call the office and ask for your surgeon's nurse.  If you have other questions about your diagnosis, plan, or surgery, call the office and ask for your surgeon's nurse.  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 Will restart asa post op.    Signed by Bradley Klein, MD (03/17/2019 10:49 AM)

## 2019-03-17 NOTE — H&P (View-Only) (Signed)
Bradley [name] Appointment: 03/17/2019 10:00 AM Location: [facility] Patient #: [id number] DOB: 10-06-1950 Married / Language: Bradley [name] / Race: White Male   History of Present Illness [name] MD; 03/17/2019 10:47 AM) The patient is a 68 year old male who presents with malignant melanoma. Pt is a 69 yo M referred for consultation by Dr. Wilhemina Bonito regarding a new dx of malignant melanoma of the right upper chest 02/2019.   Shave biopsy was performed, demonstrating a 1.0 mm superficial spreading melanoma with negative deep margin, positive peripheral margin. There was no LVI, no ulceration, no satellitosis, no additional mitoses, no neurotropism. The tumor did have regression. It was at least clark's level 4 and a T1b. He is referred for wide excision and sentinel lymph node biopsy. Of note, he has had a small melanoma on his back and on his nose. He has also had multiple basal and squamous cell cancers. He denies other cancer history. He has a long history of sun exposure, but never burns.    Past Surgical History [name], [place]; 03/17/2019 9:51 AM) Vasectomy   Diagnostic Studies History [name], [place]; 03/17/2019 9:51 AM) Colonoscopy  5-10 years ago  Allergies [name], CMA; 03/17/2019 9:52 AM) No Known Drug Allergies  [03/17/2019]: Allergies Reconciled   Medication History [name], CMA; 03/17/2019 9:53 AM) Aspirin (81MG  Tablet, Oral) Active. Atorvastatin Calcium (10MG  Tablet, Oral) Active. Chlorthalidone (25MG  Tablet, Oral) Active. Levothyroxine Sodium (50MCG Tablet, Oral) Active. Vitamin B12 (500MCG Tablet, Oral) Active. Allopurinol (100MG  Tablet, Oral) Active. amLODIPine Besylate (5MG  Tablet, Oral) Active. Fish Oil (500MG  Capsule, Oral) Active. Medications Reconciled  Social History [name], [place]; 03/17/2019 9:51 AM) Alcohol use  Moderate alcohol use. Caffeine use  Coffee, Tea. Tobacco use  Current every day  smoker.  Family History [name], [place]; 03/17/2019 9:51 AM) Alcohol Abuse  Father, Mother. Arthritis  Mother. Breast Cancer  Mother. Heart Disease  Father. Hypertension  Father. Kidney Disease  Father.  Other Problems [name], CMA; 03/17/2019 9:51 AM) Back Pain  Chronic Renal Failure Syndrome  High blood pressure  Hypercholesterolemia     Review of Systems [name] MD; 03/17/2019 10:47 AM) General Present- Fatigue. Not Present- Appetite Loss, Chills, Fever, Night Sweats, Weight Gain and Weight Loss. HEENT Present- Ringing in the Ears. Not Present- Earache, Hearing Loss, Hoarseness, Nose Bleed, Oral Ulcers, Seasonal Allergies, Sinus Pain, Sore Throat, Visual Disturbances, Wears glasses/contact lenses and Yellow Eyes. All other systems negative  Vitals [name] CMA; 03/17/2019 9:52 AM) 03/17/2019 9:51 AM Weight: 220.2 lb Height: 72in Body Surface Area: 2.22 m Body Mass Index: 29.86 kg/m  Temp.: 97.98F  Pulse: 85 (Regular)  BP: 126/78(Sitting, Left Arm, Standard)       Physical Exam [name] MD; 03/17/2019 10:48 AM) General Mental Status-Alert. General Appearance-Consistent with stated age. Hydration-Well hydrated. Voice-Normal.  Integumentary Note: raw area on right hand where squamous cell cancer was removed. Raw 1 cm area on right upper chest wall around midpoint of clavicle. No residual pigmentation.   Head and Neck Head-normocephalic, atraumatic with no lesions or palpable masses. Trachea-midline. Thyroid Gland Characteristics - normal size and consistency.  Eye Eyeball - Bilateral-Extraocular movements intact. Sclera/Conjunctiva - Bilateral-No scleral icterus.  Chest and Lung Exam Chest and lung exam reveals -quiet, even and easy respiratory effort with no use of accessory muscles and on auscultation, normal breath sounds, no adventitious sounds and normal vocal resonance. Inspection Chest  Wall - Normal. Back - normal.  Cardiovascular Cardiovascular examination reveals -  normal heart sounds, regular rate and rhythm with no murmurs and normal pedal pulses bilaterally.  Abdomen Inspection Inspection of the abdomen reveals - No Hernias. Palpation/Percussion Palpation and Percussion of the abdomen reveal - Soft, Non Tender, No Rebound tenderness, No Rigidity (guarding) and No hepatosplenomegaly. Auscultation Auscultation of the abdomen reveals - Bowel sounds normal.  Neurologic Neurologic evaluation reveals -alert and oriented x 3 with no impairment of recent or remote memory. Mental Status-Normal.  Musculoskeletal Global Assessment -Note: no gross deformities.  Normal Exam - Left-Upper Extremity Strength Normal and Lower Extremity Strength Normal. Normal Exam - Right-Upper Extremity Strength Normal and Lower Extremity Strength Normal.  Lymphatic Head & Neck  General Head & Neck Lymphatics: Bilateral - Description - Normal. Axillary  General Axillary Region: Bilateral - Description - Normal. Tenderness - Non Tender. Femoral & Inguinal  Generalized Femoral & Inguinal Lymphatics: Bilateral - Description - No Generalized lymphadenopathy.    Assessment & Plan [name] MD; 03/17/2019 10:48 AM)  MALIGNANT MELANOMA OF CHEST WALL (C43.59) Impression: Patient has at least a T1b melanoma in the upper chest region. I discussed the rational for wide excision and the surgical techniques used. I discussed the concept of sentinel node.  I reviewed the procedure with the patient including risks. I discussed risks of bleeding, infection, damage to adjacent structures, permanent numbness, possible wound breakdown, possible need for additional procedures, possible recurrent cancer, possible heart or lung complications and possible death. I discussed recovery and post op expectations. his wife was present at the visit.  We will proceed at the first available  opportunity. I advised the patient that if his nodes were positive, we would get a PET scan and refer to oncology.  Current Plans You are being scheduled for surgery- Our schedulers will call you.  You should hear from our office's scheduling department within 5 working days about the location, date, and time of surgery. We try to make accommodations for patient's preferences in scheduling surgery, but sometimes the OR schedule or the surgeon's schedule prevents [place] from making those accommodations.  If you have not heard from our office [phone] in 5 working days, call the office and ask for your surgeon's nurse.  If you have other questions about your diagnosis, plan, or surgery, call the office and ask for your surgeon's nurse.  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 Will restart asa post op.    Signed by [name], MD (03/17/2019 10:49 AM)

## 2019-03-21 ENCOUNTER — Other Ambulatory Visit (HOSPITAL_COMMUNITY)
Admission: RE | Admit: 2019-03-21 | Discharge: 2019-03-21 | Disposition: A | Discharge status: Home / Self Care | Payer: Medicare Other | Source: Ambulatory Visit | Attending: General Surgery | Admitting: General Surgery

## 2019-03-21 ENCOUNTER — Other Ambulatory Visit: Payer: Self-pay | Admitting: General Surgery

## 2019-03-21 DIAGNOSIS — Z01812 Encounter for preprocedural laboratory examination: Secondary | ICD-10-CM | POA: Diagnosis present

## 2019-03-21 DIAGNOSIS — Z20828 Contact with and (suspected) exposure to other viral communicable diseases: Secondary | ICD-10-CM | POA: Diagnosis not present

## 2019-03-21 DIAGNOSIS — C4359 Malignant melanoma of other part of trunk: Secondary | ICD-10-CM | POA: Diagnosis not present

## 2019-03-21 LAB — SARS CORONAVIRUS 2 (TAT 6-24 HRS): SARS Coronavirus 2: NEGATIVE

## 2019-03-22 ENCOUNTER — Encounter (HOSPITAL_COMMUNITY): Payer: Self-pay

## 2019-03-22 ENCOUNTER — Other Ambulatory Visit: Payer: Self-pay

## 2019-03-22 NOTE — Progress Notes (Addendum)
PCP - Dr. Marisue Humble  Cardiologist - Denies  Nephro- Dr. Hollie Salk- Per pt, he is at Stage III  Chest x-ray - Denies  EKG - 03/23/2019 (DOS)  Stress Test - 07/30/17 (E)  ECHO - 07/30/17 (E)  Cardiac Cath - Denies  AICD-na PM-na LOOP-na  Sleep Study - Denies CPAP - None  LABS- 03/23/2019: CBC w/D, CMP 03/21/2019: COVID- Neg  ASA- LD- 9/4  ERAS- Yes- no drink  Anesthesia- Yes- medical historyEbony Hail, PA for anesthesia made aware.  Pt denies having chest pain, sob, or fever during the pre-op phone call. All instructions explained to the pt, with a verbal understanding of the material including: as of today, stop taking all Aspirin (unless instructed by your doctor) and Other Aspirin containing products, Vitamins, Fish oils, and Herbal medications. Also stop all NSAIDS i.e. Advil, Ibuprofen, Motrin, Aleve, Anaprox, Naproxen, BC, Goody Powders, and all Supplements. Pt also instructed to self quarantine after being tested for COVID-19. The opportunity to ask questions was provided.   Coronavirus Screening  Have you experienced the following symptoms:  Cough yes/no: No Fever (>100.19F)  yes/no: No Runny nose yes/no: No Sore throat yes/no: No Difficulty breathing/shortness of breath  yes/no: No  Have you or a family member traveled in the last 14 days and where? yes/no: No   If the patient indicates "YES" to the above questions, their PAT will be rescheduled to limit the exposure to others and, the surgeon will be notified. THE PATIENT WILL NEED TO BE ASYMPTOMATIC FOR 14 DAYS.   If the patient is not experiencing any of these symptoms, the PAT nurse will instruct them to NOT bring anyone with them to their appointment since they may have these symptoms or traveled as well.   Please remind your patients and families that hospital visitation restrictions are in effect and the importance of the restrictions.

## 2019-03-22 NOTE — Anesthesia Preprocedure Evaluation (Addendum)
Anesthesia Evaluation  Patient identified by MRN, date of birth, ID band Patient awake    Reviewed: Allergy & Precautions, NPO status , Patient's Chart, lab work & pertinent test results  History of Anesthesia Complications (+) PONV and history of anesthetic complications  Airway Mallampati: II  TM Distance: >3 FB Neck ROM: Full    Dental no notable dental hx.    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 Wall thickness was increased in a pattern of mild LVH. Systolic function was normal. The estimated ejection fraction was in the range of 55% to 60%. Wall motion was normal; there were no regional wall motion abnormalities. Doppler parameters are consistent with abnormal left ventricular relaxation (grade 1 diastolic dysfunction). Impressions: - Normal LV systolic function; mild diastolic dysfunction; mild LVH.  Nuclear stress test 07/30/17: Study Highlights  Nuclear stress EF: 56%.  There was no ST segment deviation noted during stress.  This is a low risk study.  The left ventricular ejection fraction is normal (55-65%). 1. EF 56%, normal wall motion.  2. Fixed small, mild basal to mid inferoseptal perfusion defect. Given normal wall motion, suspect artifact (may be due to "short septum" phenomenon).  Low risk study.  - Reviewed by Dr. Johnsie Cancel who wrote, "Normal myovue study with no evidence of ischemia or infarction"   Neuro/Psych negative neurological ROS  negative psych ROS   GI/Hepatic negative GI ROS, Neg liver ROS,   Endo/Other  Hypothyroidism   Renal/GU CRFRenal diseaseCKD 3, Cr 2.17 2/2 heavy NSAID use and HTN  negative genitourinary   Musculoskeletal negative musculoskeletal ROS (+)   Abdominal   Peds negative pediatric ROS (+)  Hematology negative hematology ROS (+)   Anesthesia Other Findings Malignant melanoma chest wall  Reproductive/Obstetrics negative OB ROS                            Anesthesia Physical Anesthesia Plan  ASA: III  Anesthesia Plan: General   Post-op Pain Management:    Induction: Intravenous  PONV Risk Score and Plan: 3 and Dexamethasone, Ondansetron, Midazolam, Scopolamine patch - Pre-op and Treatment may vary due to age or medical condition  Airway Management Planned: Oral ETT  Additional Equipment: None  Intra-op Plan:   Post-operative Plan: Extubation in OR  Informed Consent: I have reviewed the patients History and Physical, chart, labs and discussed the procedure including the risks, benefits and alternatives for the proposed anesthesia with the patient or authorized representative who has indicated his/her understanding and acceptance.     Dental advisory given  Plan Discussed with: CRNA  Anesthesia Plan Comments: ( )      Anesthesia Quick Evaluation

## 2019-03-22 NOTE — Progress Notes (Signed)
Anesthesia Chart Review: Kathleene Hazel    Case: [id number] Date/Time: 03/23/19 0715   Procedure: WIDE LOCAL EXCISION WITH ADVANCEMENT FLAP CLOSURE, SENTINEL LYMPH NODE MAPPING AND BIOPSY (N/A )   Anesthesia type: General   Pre-op diagnosis: MELANOMA CHEST WALL   Location: [facility] ROOM 09 / [facility]   Surgeon: [name], MD      DISCUSSION: Patient is a 68 year old male scheduled for the above procedure.  History includes smoking, post-operative N/V, HTN, hypercholesterolemia, exertional dyspnea, CKD (stage III), hypothyroidism, melanoma (right chest), C4-7 ACDF (07/24/10). 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 Had unremarkable cardiac testing in 2019 for evaluation of dyspnea and coronary calcifications on CT (see below).   Will request last PCP and nephrology records in hopes to get more recent comparison labs. If labs and EKG are outdated then he will need those on arrival. As of 06/2017, Cr 2.17. Negative COVID-19 test on 03/21/19. Anesthesia team to evaluate on the day of surgery.    VS: For day of surgery.     PROVIDERSGaynelle Arabian, MD is PCP  - Madelon Lips, MD is nephrologist. 06/08/17 office note scanned under Media tab. CKD stage III at that time (had climbed to 1.9 in early 2018) and felt likely due to heavy NSAID use and HTN.  BUN 22, Cr 2.17 06/22/17 labs. Jenkins Rouge, MD is cardiologist. PCP or nephrologist referred patient in 2019 for dyspnea with risk factors of smoking, HTN, HLD, and coronary calcifications on chest CT. Stress and echo ordered with PRN follow-up if results okay.    LABS: He is for updated labs on arrival As of 06/2017, Cr 2.17, BUN 22, eGFR 31.    IMAGES: CT Chest lung cancer screen 07/30/17: IMPRESSION: 1. Lung-RADS 2, benign appearance or behavior. Continue annual screening with low-dose chest CT without contrast in 12 months. 2. Coronary artery atherosclerosis. Aortic Atherosclerosis (ICD10-I70.0). 3.   Emphysema (ICD10-J43.9).   EKG: He will need updated EKG if one not done within the past year. As of 07/23/17 EKG showed NSR, LAD, low voltage.   CV: Echo 07/30/17: Study Conclusions - Left ventricle: The cavity size was normal. Wall thickness was   increased in a pattern of mild LVH. Systolic function was normal.   The estimated ejection fraction was in the range of 55% to 60%.   Wall motion was normal; there were no regional wall motion   abnormalities. Doppler parameters are consistent with abnormal   left ventricular relaxation (grade 1 diastolic dysfunction). Impressions: - Normal LV systolic function; mild diastolic dysfunction; mild   LVH.  Nuclear stress test 07/30/17: Study Highlights  Nuclear stress EF: 56%.  There was no ST segment deviation noted during stress.  This is a low risk study.  The left ventricular ejection fraction is normal (55-65%). 1. EF 56%, normal wall motion.  2. Fixed small, mild basal to mid inferoseptal perfusion defect.  Given normal wall motion, suspect artifact (may be due to "short septum" phenomenon).   Low risk study.  - Reviewed by Dr. Johnsie Cancel who wrote, "Normal myovue study with no evidence of ischemia or infarction"   Past Medical History:  Diagnosis Date  . Chronic kidney disease   . Dyspnea on exertion   . Hypercholesteremia   . Hypertension   . Hypothyroidism   . Malignant melanoma of chest wall (HCC)    Right  . PONV (postoperative nausea and vomiting)     Past Surgical History:  Procedure Laterality Date  . CERVICAL DISC SURGERY      MEDICATIONS: No current facility-administered medications for this encounter.    Marland Kitchen acetaminophen (TYLENOL) 500 MG tablet  . allopurinol (ZYLOPRIM) 100 MG tablet  . amLODipine (NORVASC) 5 MG tablet  . aspirin EC 81 MG tablet  . atorvastatin (LIPITOR) 10 MG tablet  . chlorthalidone (HYGROTON) 25 MG tablet  . Cyanocobalamin (B-12 COMPLIANCE INJECTION IJ)  . doxylamine, Sleep, (UNISOM)  25 MG tablet  . levothyroxine (SYNTHROID) 50 MCG tablet  . Omega-3 Fatty Acids (FISH OIL) 1000 MG CAPS    Myra Gianotti, PA-C Surgical Short Stay/Anesthesiology [facility] Phone [phone] [facility] Phone [phone] 03/22/2019 12:01 PM

## 2019-03-23 ENCOUNTER — Ambulatory Visit (HOSPITAL_COMMUNITY)
Admission: RE | Admit: 2019-03-23 | Discharge: 2019-03-23 | Disposition: A | Discharge status: Home / Self Care | Payer: Medicare Other | Attending: General Surgery | Admitting: General Surgery

## 2019-03-23 ENCOUNTER — Encounter (HOSPITAL_COMMUNITY)
Admission: RE | Admit: 2019-03-23 | Discharge: 2019-03-23 | Disposition: A | Discharge status: Home / Self Care | Payer: Medicare Other | Source: Ambulatory Visit | Attending: General Surgery | Admitting: General Surgery

## 2019-03-23 ENCOUNTER — Encounter (HOSPITAL_COMMUNITY)
Admission: RE | Disposition: A | Discharge status: Home / Self Care | Payer: Self-pay | Source: Home / Self Care | Attending: General Surgery

## 2019-03-23 ENCOUNTER — Ambulatory Visit (HOSPITAL_COMMUNITY): Payer: Medicare Other

## 2019-03-23 ENCOUNTER — Encounter (HOSPITAL_COMMUNITY): Payer: Self-pay | Admitting: Anesthesiology

## 2019-03-23 DIAGNOSIS — I2510 Atherosclerotic heart disease of native coronary artery without angina pectoris: Secondary | ICD-10-CM | POA: Diagnosis not present

## 2019-03-23 DIAGNOSIS — C4361 Malignant melanoma of right upper limb, including shoulder: Secondary | ICD-10-CM

## 2019-03-23 DIAGNOSIS — Z79899 Other long term (current) drug therapy: Secondary | ICD-10-CM | POA: Insufficient documentation

## 2019-03-23 DIAGNOSIS — Z7982 Long term (current) use of aspirin: Secondary | ICD-10-CM | POA: Insufficient documentation

## 2019-03-23 DIAGNOSIS — N183 Chronic kidney disease, stage 3 (moderate): Secondary | ICD-10-CM | POA: Insufficient documentation

## 2019-03-23 DIAGNOSIS — E039 Hypothyroidism, unspecified: Secondary | ICD-10-CM | POA: Diagnosis not present

## 2019-03-23 DIAGNOSIS — J439 Emphysema, unspecified: Secondary | ICD-10-CM | POA: Insufficient documentation

## 2019-03-23 DIAGNOSIS — C4359 Malignant melanoma of other part of trunk: Secondary | ICD-10-CM | POA: Diagnosis present

## 2019-03-23 DIAGNOSIS — F17200 Nicotine dependence, unspecified, uncomplicated: Secondary | ICD-10-CM | POA: Diagnosis not present

## 2019-03-23 DIAGNOSIS — I129 Hypertensive chronic kidney disease with stage 1 through stage 4 chronic kidney disease, or unspecified chronic kidney disease: Secondary | ICD-10-CM | POA: Insufficient documentation

## 2019-03-23 DIAGNOSIS — Z86718 Personal history of other venous thrombosis and embolism: Secondary | ICD-10-CM | POA: Insufficient documentation

## 2019-03-23 DIAGNOSIS — E7800 Pure hypercholesterolemia, unspecified: Secondary | ICD-10-CM | POA: Insufficient documentation

## 2019-03-23 DIAGNOSIS — Z79890 Hormone replacement therapy (postmenopausal): Secondary | ICD-10-CM | POA: Diagnosis not present

## 2019-03-23 HISTORY — DX: Chronic kidney disease, unspecified: N18.9

## 2019-03-23 HISTORY — DX: Other specified postprocedural states: Z98.890

## 2019-03-23 HISTORY — DX: Malignant melanoma of other part of trunk: C43.59

## 2019-03-23 HISTORY — DX: Hypothyroidism, unspecified: E03.9

## 2019-03-23 HISTORY — DX: Nausea with vomiting, unspecified: R11.2

## 2019-03-23 HISTORY — PX: MELANOMA EXCISION WITH SENTINEL LYMPH NODE BIOPSY: SHX5267

## 2019-03-23 LAB — CBC WITH DIFFERENTIAL/PLATELET
Abs Immature Granulocytes: 0.03 10*3/uL (ref 0.00–0.07)
Basophils Absolute: 0.1 10*3/uL (ref 0.0–0.1)
Basophils Relative: 2 %
Eosinophils Absolute: 0.3 10*3/uL (ref 0.0–0.5)
Eosinophils Relative: 4 %
HCT: 37.3 % — ABNORMAL LOW (ref 39.0–52.0)
Hemoglobin: 12.2 g/dL — ABNORMAL LOW (ref 13.0–17.0)
Immature Granulocytes: 1 %
Lymphocytes Relative: 26 %
Lymphs Abs: 1.7 10*3/uL (ref 0.7–4.0)
MCH: 32.7 pg (ref 26.0–34.0)
MCHC: 32.7 g/dL (ref 30.0–36.0)
MCV: 100 fL (ref 80.0–100.0)
Monocytes Absolute: 0.6 10*3/uL (ref 0.1–1.0)
Monocytes Relative: 8 %
Neutro Abs: 4 10*3/uL (ref 1.7–7.7)
Neutrophils Relative %: 59 %
Platelets: 255 10*3/uL (ref 150–400)
RBC: 3.73 MIL/uL — ABNORMAL LOW (ref 4.22–5.81)
RDW: 16 % — ABNORMAL HIGH (ref 11.5–15.5)
WBC: 6.6 10*3/uL (ref 4.0–10.5)
nRBC: 0 % (ref 0.0–0.2)

## 2019-03-23 LAB — COMPREHENSIVE METABOLIC PANEL
ALT: 22 U/L (ref 0–44)
AST: 28 U/L (ref 15–41)
Albumin: 4.1 g/dL (ref 3.5–5.0)
Alkaline Phosphatase: 66 U/L (ref 38–126)
Anion gap: 12 (ref 5–15)
BUN: 23 mg/dL (ref 8–23)
CO2: 25 mmol/L (ref 22–32)
Calcium: 9.5 mg/dL (ref 8.9–10.3)
Chloride: 101 mmol/L (ref 98–111)
Creatinine, Ser: 1.87 mg/dL — ABNORMAL HIGH (ref 0.61–1.24)
GFR calc Af Amer: 42 mL/min — ABNORMAL LOW (ref >60)
GFR calc non Af Amer: 36 mL/min — ABNORMAL LOW (ref >60)
Glucose, Bld: 104 mg/dL — ABNORMAL HIGH (ref 70–99)
Potassium: 3.1 mmol/L — ABNORMAL LOW (ref 3.5–5.1)
Sodium: 138 mmol/L (ref 135–145)
Total Bilirubin: 0.8 mg/dL (ref 0.3–1.2)
Total Protein: 7.2 g/dL (ref 6.5–8.1)

## 2019-03-23 SURGERY — MELANOMA EXCISION WITH SENTINEL LYMPH NODE BIOPSY
Anesthesia: General | Site: Chest | Laterality: Right

## 2019-03-23 MED ORDER — OXYCODONE HCL 5 MG PO TABS: 5.0000 mg | ORAL_TABLET | Freq: Once | ORAL | Status: DC | PRN

## 2019-03-23 MED ORDER — CHLORHEXIDINE GLUCONATE CLOTH 2 % EX PADS: 6.0000 | MEDICATED_PAD | Freq: Once | CUTANEOUS | Status: DC

## 2019-03-23 MED ORDER — LIDOCAINE HCL 1 % IJ SOLN
INTRAMUSCULAR | Status: DC | PRN
  Administered 2019-03-23: 25 mL via INTRAMUSCULAR

## 2019-03-23 MED ORDER — ACETAMINOPHEN 500 MG PO TABS
1000.0000 mg | ORAL_TABLET | Freq: Once | ORAL | Status: AC
  Administered 2019-03-23: 06:00:00 1000 mg via ORAL

## 2019-03-23 MED ORDER — FENTANYL CITRATE (PF) 250 MCG/5ML IJ SOLN
INTRAMUSCULAR | Status: AC
  Filled 2019-03-23: qty 5

## 2019-03-23 MED ORDER — ACETAMINOPHEN 500 MG PO TABS
ORAL_TABLET | ORAL | Status: AC
  Filled 2019-03-23: qty 2

## 2019-03-23 MED ORDER — TECHNETIUM TC 99M SULFUR COLLOID FILTERED
1.0000 | Freq: Once | INTRAVENOUS | Status: AC | PRN
  Administered 2019-03-23: 1 via INTRADERMAL

## 2019-03-23 MED ORDER — DEXAMETHASONE SODIUM PHOSPHATE 4 MG/ML IJ SOLN
INTRAMUSCULAR | Status: DC | PRN
  Administered 2019-03-23: 10 mg via INTRAVENOUS

## 2019-03-23 MED ORDER — METHYLENE BLUE 1 % INJ SOLN
INTRAMUSCULAR | Status: DC | PRN
  Administered 2019-03-23: 1 mL via SUBMUCOSAL

## 2019-03-23 MED ORDER — PROPOFOL 10 MG/ML IV BOLUS
INTRAVENOUS | Status: AC
  Filled 2019-03-23: qty 40

## 2019-03-23 MED ORDER — MIDAZOLAM HCL 2 MG/2ML IJ SOLN
INTRAMUSCULAR | Status: AC
  Filled 2019-03-23: qty 2

## 2019-03-23 MED ORDER — PROPOFOL 10 MG/ML IV BOLUS
INTRAVENOUS | Status: DC | PRN
  Administered 2019-03-23: 150 mg via INTRAVENOUS

## 2019-03-23 MED ORDER — ACETAMINOPHEN 500 MG PO TABS: 1000.0000 mg | ORAL_TABLET | ORAL | Status: DC

## 2019-03-23 MED ORDER — EPHEDRINE SULFATE 50 MG/ML IJ SOLN
INTRAMUSCULAR | Status: DC | PRN
  Administered 2019-03-23 (×2): 10 mg via INTRAVENOUS

## 2019-03-23 MED ORDER — METHYLENE BLUE 0.5 % INJ SOLN
INTRAVENOUS | Status: AC
  Filled 2019-03-23: qty 10

## 2019-03-23 MED ORDER — MIDAZOLAM HCL 5 MG/5ML IJ SOLN
INTRAMUSCULAR | Status: DC | PRN
  Administered 2019-03-23: 2 mg via INTRAVENOUS

## 2019-03-23 MED ORDER — LIDOCAINE 2% (20 MG/ML) 5 ML SYRINGE
INTRAMUSCULAR | Status: DC | PRN
  Administered 2019-03-23: 100 mg via INTRAVENOUS

## 2019-03-23 MED ORDER — SCOPOLAMINE 1 MG/3DAYS TD PT72
1.0000 | MEDICATED_PATCH | TRANSDERMAL | Status: DC
  Administered 2019-03-23: 06:00:00 1.5 mg via TRANSDERMAL
  Filled 2019-03-23: qty 1

## 2019-03-23 MED ORDER — PROMETHAZINE HCL 25 MG/ML IJ SOLN: 6.2500 mg | INTRAMUSCULAR | Status: DC | PRN

## 2019-03-23 MED ORDER — GABAPENTIN 100 MG PO CAPS
ORAL_CAPSULE | ORAL | Status: AC
  Filled 2019-03-23: qty 2

## 2019-03-23 MED ORDER — LACTATED RINGERS IV SOLN
INTRAVENOUS | Status: DC | PRN
  Administered 2019-03-23: 07:00:00 via INTRAVENOUS

## 2019-03-23 MED ORDER — TRAMADOL HCL 50 MG PO TABS: 50.0000 mg | ORAL_TABLET | Freq: Four times a day (QID) | ORAL | 0 refills | Status: DC | PRN

## 2019-03-23 MED ORDER — ONDANSETRON HCL 4 MG/2ML IJ SOLN
INTRAMUSCULAR | Status: DC | PRN
  Administered 2019-03-23: 4 mg via INTRAVENOUS

## 2019-03-23 MED ORDER — LIDOCAINE HCL (PF) 1 % IJ SOLN
INTRAMUSCULAR | Status: AC
  Filled 2019-03-23: qty 30

## 2019-03-23 MED ORDER — OXYCODONE HCL 5 MG/5ML PO SOLN: 5.0000 mg | Freq: Once | ORAL | Status: DC | PRN

## 2019-03-23 MED ORDER — PROPOFOL 10 MG/ML IV BOLUS
INTRAVENOUS | Status: AC
  Filled 2019-03-23: qty 20

## 2019-03-23 MED ORDER — STERILE WATER FOR IRRIGATION IR SOLN
Status: DC | PRN
  Administered 2019-03-23: 1000 mL

## 2019-03-23 MED ORDER — ONDANSETRON HCL 4 MG/2ML IJ SOLN
INTRAMUSCULAR | Status: AC
  Filled 2019-03-23: qty 2

## 2019-03-23 MED ORDER — CEFAZOLIN SODIUM-DEXTROSE 2-4 GM/100ML-% IV SOLN
INTRAVENOUS | Status: AC
  Filled 2019-03-23: qty 100

## 2019-03-23 MED ORDER — EPHEDRINE 5 MG/ML INJ
INTRAVENOUS | Status: AC
  Filled 2019-03-23: qty 10

## 2019-03-23 MED ORDER — HYDROMORPHONE HCL 1 MG/ML IJ SOLN: 0.2500 mg | INTRAMUSCULAR | Status: DC | PRN

## 2019-03-23 MED ORDER — GABAPENTIN 100 MG PO CAPS
200.0000 mg | ORAL_CAPSULE | ORAL | Status: AC
  Administered 2019-03-23: 06:00:00 200 mg via ORAL

## 2019-03-23 MED ORDER — DEXAMETHASONE SODIUM PHOSPHATE 10 MG/ML IJ SOLN
INTRAMUSCULAR | Status: AC
  Filled 2019-03-23: qty 1

## 2019-03-23 MED ORDER — BUPIVACAINE-EPINEPHRINE (PF) 0.25% -1:200000 IJ SOLN
INTRAMUSCULAR | Status: AC
  Filled 2019-03-23: qty 30

## 2019-03-23 MED ORDER — LIDOCAINE HCL 1 % IJ SOLN
INTRAMUSCULAR | Status: AC
  Filled 2019-03-23: qty 20

## 2019-03-23 MED ORDER — CEFAZOLIN SODIUM-DEXTROSE 2-4 GM/100ML-% IV SOLN
2.0000 g | INTRAVENOUS | Status: AC
  Administered 2019-03-23: 2 g via INTRAVENOUS

## 2019-03-23 MED ORDER — 0.9 % SODIUM CHLORIDE (POUR BTL) OPTIME
TOPICAL | Status: DC | PRN
  Administered 2019-03-23: 1000 mL

## 2019-03-23 MED ORDER — FENTANYL CITRATE (PF) 100 MCG/2ML IJ SOLN
INTRAMUSCULAR | Status: DC | PRN
  Administered 2019-03-23: 25 ug via INTRAVENOUS
  Administered 2019-03-23: 50 ug via INTRAVENOUS
  Administered 2019-03-23: 25 ug via INTRAVENOUS

## 2019-03-23 SURGICAL SUPPLY — 66 items
ADH SKN CLS APL DERMABOND .7 (GAUZE/BANDAGES/DRESSINGS) ×1
APL PRP STRL LF DISP 70% ISPRP (MISCELLANEOUS) ×2
APL SKNCLS STERI-STRIP NONHPOA (GAUZE/BANDAGES/DRESSINGS) ×1
BENZOIN TINCTURE PRP APPL 2/3 (GAUZE/BANDAGES/DRESSINGS) ×3 IMPLANT
BLADE SURG 10 STRL SS (BLADE) ×3 IMPLANT
BNDG COHESIVE 4X5 TAN STRL (GAUZE/BANDAGES/DRESSINGS) ×3 IMPLANT
BNDG GAUZE ELAST 4 BULKY (GAUZE/BANDAGES/DRESSINGS) ×3 IMPLANT
CANISTER SUCT 3000ML PPV (MISCELLANEOUS) ×3 IMPLANT
CHLORAPREP W/TINT 26 (MISCELLANEOUS) ×6 IMPLANT
CLIP VESOCCLUDE LG 6/CT (CLIP) ×3 IMPLANT
CLIP VESOCCLUDE MED 24/CT (CLIP) ×3 IMPLANT
CLIP VESOCCLUDE SM WIDE 24/CT (CLIP) ×3 IMPLANT
CLOSURE WOUND 1/2 X4 (GAUZE/BANDAGES/DRESSINGS) ×1
CONT SPEC 4OZ CLIKSEAL STRL BL (MISCELLANEOUS) ×9 IMPLANT
COVER MAYO STAND STRL (DRAPES) IMPLANT
COVER PROBE W GEL 5X96 (DRAPES) ×3 IMPLANT
COVER SURGICAL LIGHT HANDLE (MISCELLANEOUS) ×3 IMPLANT
COVER WAND RF STERILE (DRAPES) IMPLANT
DECANTER SPIKE VIAL GLASS SM (MISCELLANEOUS) ×6 IMPLANT
DERMABOND ADVANCED (GAUZE/BANDAGES/DRESSINGS) ×2
DERMABOND ADVANCED .7 DNX12 (GAUZE/BANDAGES/DRESSINGS) ×1 IMPLANT
DRAPE HALF SHEET 40X57 (DRAPES) ×3 IMPLANT
DRAPE LAPAROSCOPIC ABDOMINAL (DRAPES) ×3 IMPLANT
DRSG TEGADERM 4X4.75 (GAUZE/BANDAGES/DRESSINGS) ×3 IMPLANT
ELECT REM PT RETURN 9FT ADLT (ELECTROSURGICAL) ×3
ELECTRODE REM PT RTRN 9FT ADLT (ELECTROSURGICAL) ×1 IMPLANT
GAUZE SPONGE 2X2 8PLY STRL LF (GAUZE/BANDAGES/DRESSINGS) IMPLANT
GAUZE SPONGE 4X4 12PLY STRL (GAUZE/BANDAGES/DRESSINGS) ×3 IMPLANT
GLOVE BIO SURGEON STRL SZ 6 (GLOVE) ×3 IMPLANT
GLOVE BIO SURGEON STRL SZ 6.5 (GLOVE) ×2 IMPLANT
GLOVE BIO SURGEON STRL SZ7.5 (GLOVE) ×3 IMPLANT
GLOVE BIO SURGEONS STRL SZ 6.5 (GLOVE) ×1
GLOVE BIOGEL PI IND STRL 6.5 (GLOVE) ×2 IMPLANT
GLOVE BIOGEL PI INDICATOR 6.5 (GLOVE) ×4
GLOVE INDICATOR 6.5 STRL GRN (GLOVE) ×3 IMPLANT
GLOVE INDICATOR 7.5 STRL GRN (GLOVE) ×3 IMPLANT
GOWN STRL REUS W/ TWL LRG LVL3 (GOWN DISPOSABLE) ×2 IMPLANT
GOWN STRL REUS W/TWL 2XL LVL3 (GOWN DISPOSABLE) ×3 IMPLANT
GOWN STRL REUS W/TWL LRG LVL3 (GOWN DISPOSABLE) ×6
KIT BASIN OR (CUSTOM PROCEDURE TRAY) ×3 IMPLANT
KIT TURNOVER KIT B (KITS) ×3 IMPLANT
MARKER SKIN DUAL TIP RULER LAB (MISCELLANEOUS) ×6 IMPLANT
NEEDLE 18GX1X1/2 (RX/OR ONLY) (NEEDLE) IMPLANT
NEEDLE FILTER BLUNT 18X 1/2SAF (NEEDLE)
NEEDLE FILTER BLUNT 18X1 1/2 (NEEDLE) IMPLANT
NEEDLE HYPO 25GX1X1/2 BEV (NEEDLE) ×6 IMPLANT
NS IRRIG 1000ML POUR BTL (IV SOLUTION) ×3 IMPLANT
PACK GENERAL/GYN (CUSTOM PROCEDURE TRAY) ×3 IMPLANT
PACK UNIVERSAL I (CUSTOM PROCEDURE TRAY) ×3 IMPLANT
PAD ARMBOARD 7.5X6 YLW CONV (MISCELLANEOUS) ×6 IMPLANT
PENCIL SMOKE EVACUATOR (MISCELLANEOUS) ×3 IMPLANT
SLEEVE SURGEON STRL (DRAPES) ×3 IMPLANT
SPECIMEN JAR MEDIUM (MISCELLANEOUS) ×3 IMPLANT
SPONGE GAUZE 2X2 STER 10/PKG (GAUZE/BANDAGES/DRESSINGS)
STOCKINETTE IMPERVIOUS 9X36 MD (GAUZE/BANDAGES/DRESSINGS) ×3 IMPLANT
STRIP CLOSURE SKIN 1/2X4 (GAUZE/BANDAGES/DRESSINGS) ×2 IMPLANT
SUT ETHILON 2 0 FS 18 (SUTURE) ×3 IMPLANT
SUT VIC AB 2-0 SH 27 (SUTURE) ×6
SUT VIC AB 2-0 SH 27XBRD (SUTURE) ×2 IMPLANT
SUT VIC AB 3-0 SH 27 (SUTURE) ×6
SUT VIC AB 3-0 SH 27X BRD (SUTURE) ×2 IMPLANT
SUT VIC AB 4-0 PS2 27 (SUTURE) ×3 IMPLANT
SUT VICRYL 4-0 PS2 18IN ABS (SUTURE) ×6 IMPLANT
SYR CONTROL 10ML LL (SYRINGE) ×6 IMPLANT
TOWEL GREEN STERILE (TOWEL DISPOSABLE) ×3 IMPLANT
TOWEL GREEN STERILE FF (TOWEL DISPOSABLE) ×3 IMPLANT

## 2019-03-23 NOTE — Interval H&P Note (Signed)
History and Physical Interval Note:  03/23/2019 7:49 AM  Bradley [name]  has presented today for surgery, with the diagnosis of MELANOMA CHEST WALL.  The various methods of treatment have been discussed with the patient and family. After consideration of risks, benefits and other options for treatment, the patient has consented to  Procedure(s): WIDE LOCAL EXCISION WITH ADVANCEMENT FLAP CLOSURE, SENTINEL LYMPH NODE MAPPING AND BIOPSY (Right) as a surgical intervention.  The patient's history has been reviewed, patient examined, no change in status, stable for surgery.  I have reviewed the patient's chart and labs.  Questions were answered to the patient's satisfaction.     [name]

## 2019-03-23 NOTE — Discharge Instructions (Addendum)
[place] Office Phone Number [phone]   POST OP INSTRUCTIONS  Always review your discharge instruction sheet given to you by the facility where your surgery was performed.  IF YOU HAVE DISABILITY OR FAMILY LEAVE FORMS, YOU MUST BRING THEM TO THE OFFICE FOR PROCESSING.  DO NOT GIVE THEM TO YOUR DOCTOR.  1. A prescription for pain medication may be given to you upon discharge.  Take your pain medication as prescribed, if needed.  If narcotic pain medicine is not needed, then you may take acetaminophen (Tylenol) or ibuprofen (Advil) as needed. 2. Take your usually prescribed medications unless otherwise directed 3. If you need a refill on your pain medication, please contact your pharmacy.  They will contact our office to request authorization.  Prescriptions will not be filled after 5pm or on week-ends. 4. You should eat very light the first 24 hours after surgery, such as soup, crackers, pudding, etc.  Resume your normal diet the day after surgery 5. It is common to experience some constipation if taking pain medication after surgery.  Increasing fluid intake and taking a stool softener will usually help or prevent this problem from occurring.  A mild laxative (Milk of Magnesia or Miralax) should be taken according to package directions if there are no bowel movements after 48 hours. 6. You may shower in 48 hours.  The surgical glue will flake off in 2-3 weeks.   7. ACTIVITIES:  No strenuous activity or heavy lifting for 1-2 weeks.   a. You may drive when you no longer are taking prescription pain medication, you can comfortably wear a seatbelt, and you can safely maneuver your car and apply brakes. b. RETURN TO WORK:  __________as tolerated if applicable as long as no lifting for 1-2 weeks.  _______________ Dennis Bast should see your doctor in the office for a follow-up appointment approximately three-four weeks after your surgery.    WHEN TO CALL YOUR DOCTOR: 1. Fever over  101.0 2. Nausea and/or vomiting. 3. Extreme swelling or bruising. 4. Continued bleeding from incision. 5. Increased pain, redness, or drainage from the incision.  The clinic staff is available to answer your questions during regular business hours.  Please dont hesitate to call and ask to speak to one of the nurses for clinical concerns.  If you have a medical emergency, go to the nearest emergency room or call 911.  A surgeon from [facility] is always on call at the hospital.  For further questions, please visit [email or web address]

## 2019-03-23 NOTE — Op Note (Signed)
PRE-OPERATIVE DIAGNOSIS: cT1bN0 right upper chest wall melanoma  POST-OPERATIVE DIAGNOSIS:  Same  PROCEDURE:  Procedure(s): Wide local excision 1-2 cm margins, advancement flap closure for defect 6 cm x 3 cm, right axillary sentinel lymph node mapping and biopsy  SURGEON:  Surgeon(s): [name], MD  ASSIST:  Sharyn Dross, RNFA  ANESTHESIA:   local and general  DRAINS: none   LOCAL MEDICATIONS USED:  MARCAINE    and XYLOCAINE   SPECIMEN:  Source of Specimen:  Two deep right axillary sentinel lymph nodes, wide local excision right chest wall melanoma   FINDINGS:  First node hot, second node palpable and slight blue tinge.    DISPOSITION OF SPECIMEN:  PATHOLOGY  COUNTS:  YES  PLAN OF CARE: Discharge to home after PACU  PATIENT DISPOSITION:  PACU - hemodynamically stable.    PROCEDURE:   Pt was identified in the holding area, taken to the OR, and placed supine on the OR table.  General anesthesia was induced.  Time out was performed according to the surgical safety checklist.  When all was correct, we continued.  One mL methylene blue was injected intradermally around the melanoma biopsy site.    The patient was placed into the supine position with right arm extended.  The right arm and upper chest were prepped and draped in sterile fashion.  The melanoma was identified and an ellipse incorporating 1-2 cm margins were marked out.  Local was administered under the melanoma and the adjacent tissue.  A #10 blade was used to incise the skin around the melanoma.  The cautery was used to take the dissection down to the fascia.  The skin was marked in situ with orientation sutures.  The cautery was used to take the specimen off the fascia, and it was passed off the table.    Skin hooks were used to elevate the edges of the incision and the skin was freed up in all directions.  This was pulled together in an transverse/slightly oblique orientation. The skin was pulled together to check  the tension. . Deep interrupted 2-0 vicryl sutures were placed to relieve tension.  The skin was then reapproximated with 3-0 interrupted vicryl deep dermal sutures and 4-0 monocryl running subcuticular sutures.  Three 2-0 nylon horizontal mattress sutures were placed as well.    The point of maximum signal intensity was identified with the neoprobe.  Local anesthetic was administered into the skin.  A 4 cm incision was made with a #15 blade.  The subcutaneous tissues were divided with the cautery.  A Weitlaner retractor was used to assist with visualization.  The tonsil clamp was used to bluntly dissect the axillary fat pad.  Two sentinel lymph nodes were identified as described above.  The lymphovascular channels were clipped with hemoclips.  The nodes were passed off as specimens.  Hemostasis was achieved with the cautery.  The axilla was irrigated and closed with 3-0 Vicryl deep dermal interrupted sutures and 4-0 Monocryl running subcuticular suture.  The axilla was dressed with dermabond.    The melanoma site was cleaned, dried, and dressed with Benzoin, steristrips, gauze, and tegaderm.    Needle, sponge, and instrument counts were correct.  The patient was awakened from anesthesia and taken to the PACU in stable condition.

## 2019-03-23 NOTE — Anesthesia Postprocedure Evaluation (Signed)
Anesthesia Post Note  Patient: Bradley [name]  Procedure(s) Performed: WIDE LOCAL EXCISION WITH ADVANCEMENT FLAP CLOSURE, SENTINEL LYMPH NODE MAPPING AND BIOPSY (Right Chest)     Anesthesia Post Evaluation  Last Vitals:  Vitals:   03/23/19 1050 03/23/19 1059  BP:  120/79  Pulse:  61  Resp:  16  Temp: (!) 36.4 C   SpO2:  94%    Last Pain:  Vitals:   03/23/19 1059  TempSrc:   PainSc: [place]

## 2019-03-23 NOTE — Transfer of Care (Signed)
Immediate Anesthesia Transfer of Care Note  Patient: Bradley [name]  Procedure(s) Performed: WIDE LOCAL EXCISION WITH ADVANCEMENT FLAP CLOSURE, SENTINEL LYMPH NODE MAPPING AND BIOPSY (Right Chest)  Patient Location: PACU  Anesthesia Type:General  Level of Consciousness: awake and alert   Airway & Oxygen Therapy: Patient Spontanous Breathing and Patient connected to face mask oxygen  Post-op Assessment: Report given to RN and Post -op Vital signs reviewed and stable  Post vital signs: Reviewed and stable  Last Vitals:  Vitals Value Taken Time  BP 125/86 03/23/19 0953  Temp    Pulse 70 03/23/19 0955  Resp 12 03/23/19 0955  SpO2 100 % 03/23/19 0955  Vitals shown include unvalidated device data.  Last Pain:  Vitals:   03/23/19 0554  TempSrc: Oral         Complications: No apparent anesthesia complications

## 2019-03-23 NOTE — Anesthesia Procedure Notes (Signed)
Procedure Name: LMA Insertion Date/Time: 03/23/2019 8:02 AM Performed by: Lieutenant Diego, CRNA Pre-anesthesia Checklist: Patient identified, Emergency Drugs available, Suction available and Patient being monitored Patient Re-evaluated:Patient Re-evaluated prior to induction Oxygen Delivery Method: Circle system utilized Preoxygenation: Pre-oxygenation with 100% oxygen Induction Type: IV induction Ventilation: Mask ventilation without difficulty LMA: LMA inserted LMA Size: 5.0 Number of attempts: 1 Placement Confirmation: positive ETCO2 and breath sounds checked- equal and bilateral Tube secured with: Tape Dental Injury: Teeth and Oropharynx as per pre-operative assessment

## 2019-03-24 ENCOUNTER — Encounter (HOSPITAL_COMMUNITY): Payer: Self-pay | Admitting: General Surgery

## 2019-03-29 NOTE — Progress Notes (Signed)
Please let patient know margins and lymph nodes are negative.

## 2019-09-15 ENCOUNTER — Other Ambulatory Visit: Payer: Self-pay | Admitting: Family Medicine

## 2019-09-15 ENCOUNTER — Ambulatory Visit
Admission: RE | Admit: 2019-09-15 | Discharge: 2019-09-15 | Disposition: A | Discharge status: Home / Self Care | Payer: Medicare Other | Source: Ambulatory Visit | Attending: Family Medicine | Admitting: Family Medicine

## 2019-09-15 DIAGNOSIS — R634 Abnormal weight loss: Secondary | ICD-10-CM

## 2019-11-03 ENCOUNTER — Other Ambulatory Visit: Payer: Self-pay | Admitting: Family Medicine

## 2019-11-03 DIAGNOSIS — R634 Abnormal weight loss: Secondary | ICD-10-CM

## 2019-11-21 ENCOUNTER — Ambulatory Visit
Admission: RE | Admit: 2019-11-21 | Discharge: 2019-11-21 | Disposition: A | Discharge status: Home / Self Care | Payer: Medicare Other | Source: Ambulatory Visit | Attending: Family Medicine | Admitting: Family Medicine

## 2019-11-21 ENCOUNTER — Other Ambulatory Visit: Payer: Self-pay

## 2019-11-21 DIAGNOSIS — R634 Abnormal weight loss: Secondary | ICD-10-CM

## 2019-11-21 MED ORDER — IOPAMIDOL (ISOVUE-300) INJECTION 61%
70.0000 mL | Freq: Once | INTRAVENOUS | Status: AC | PRN
  Administered 2019-11-21: 70 mL via INTRAVENOUS

## 2020-10-17 ENCOUNTER — Other Ambulatory Visit: Payer: Self-pay | Admitting: *Deleted

## 2020-10-17 DIAGNOSIS — Z87891 Personal history of nicotine dependence: Secondary | ICD-10-CM

## 2020-10-17 DIAGNOSIS — F17210 Nicotine dependence, cigarettes, uncomplicated: Secondary | ICD-10-CM

## 2020-11-25 ENCOUNTER — Ambulatory Visit
Admission: RE | Admit: 2020-11-25 | Discharge: 2020-11-25 | Disposition: A | Discharge status: Home / Self Care | Payer: Medicare Other | Source: Ambulatory Visit | Attending: Acute Care | Admitting: Acute Care

## 2020-11-25 ENCOUNTER — Encounter: Payer: Self-pay | Admitting: Acute Care

## 2020-11-25 ENCOUNTER — Other Ambulatory Visit: Payer: Self-pay

## 2020-11-25 ENCOUNTER — Ambulatory Visit (INDEPENDENT_AMBULATORY_CARE_PROVIDER_SITE_OTHER): Payer: Medicare Other | Admitting: Acute Care

## 2020-11-25 VITALS — BP 138/88 | HR 74 | Temp 98.5°F | Ht 72.0 in | Wt 227.0 lb

## 2020-11-25 DIAGNOSIS — F17210 Nicotine dependence, cigarettes, uncomplicated: Secondary | ICD-10-CM

## 2020-11-25 DIAGNOSIS — Z87891 Personal history of nicotine dependence: Secondary | ICD-10-CM

## 2020-11-25 NOTE — Progress Notes (Signed)
Shared Decision Making Visit Lung Cancer Screening Program [phone])   Eligibility:  Age 70 y.o.  Pack Years Smoking History Calculation 39 pack year smoking history (# packs/per year x # years smoked)  Recent History of coughing up blood  no  Unexplained weight loss? no ( >Than 15 pounds within the last 6 months )  Prior History Lung / other cancer no (Diagnosis within the last 5 years already requiring surveillance chest CT Scans).  Smoking Status Current Smoker  Former Smokers: Years since quit: NA  Quit Date: NA  Visit Components:  Discussion included one or more decision making aids. yes  Discussion included risk/benefits of screening. yes  Discussion included potential follow up diagnostic testing for abnormal scans. yes  Discussion included meaning and risk of over diagnosis. yes  Discussion included meaning and risk of False Positives. yes  Discussion included meaning of total radiation exposure. yes  Counseling Included:  Importance of adherence to annual lung cancer LDCT screening. yes  Impact of comorbidities on ability to participate in the program. yes  Ability and willingness to under diagnostic treatment. yes  Smoking Cessation Counseling:  Current Smokers:   Discussed importance of smoking cessation. yes  Information about tobacco cessation classes and interventions provided to patient. yes  Patient provided with "ticket" for LDCT Scan. yes  Symptomatic Patient. no  Counseling NA  Diagnosis Code: Tobacco Use Z72.0  Asymptomatic Patient yes  Counseling (Intermediate counseling: > three minutes counseling) U4403  Former Smokers:   Discussed the importance of maintaining cigarette abstinence. yes  Diagnosis Code: Personal History of Nicotine Dependence. K74.259  Information about tobacco cessation classes and interventions provided to patient. Yes  Patient provided with "ticket" for LDCT Scan. yes  Written Order for Lung Cancer  Screening with LDCT placed in Epic. Yes (CT Chest Lung Cancer Screening Low Dose W/O CM) [id number] Z12.2-Screening of respiratory organs Z87.891-Personal history of nicotine dependence  I have spent 25 minutes of face to face time with Mr. Kring discussing the risks and benefits of lung cancer screening. We viewed a power point together that explained in detail the above noted topics. We paused at intervals to allow for questions to be asked and answered to ensure understanding.We discussed that the single most powerful action that he can take to decrease his risk of developing lung cancer is to quit smoking. We discussed whether or not he is ready to commit to setting a quit date. We discussed options for tools to aid in quitting smoking including nicotine replacement therapy, non-nicotine medications, support groups, Quit Smart classes, and behavior modification. We discussed that often times setting smaller, more achievable goals, such as eliminating 1 cigarette a day for a week and then 2 cigarettes a day for a week can be helpful in slowly decreasing the number of cigarettes smoked. This allows for a sense of accomplishment as well as providing a clinical benefit. I gave him the " Be Stronger Than Your Excuses" card with contact information for community resources, classes, free nicotine replacement therapy, and access to mobile apps, text messaging, and on-line smoking cessation help. I have also given him my card and contact information in the event he needs to contact me. We discussed the time and location of the scan, and that either Doroteo Glassman RN or I will call with the results within 24-48 hours of receiving them. I have offered him  a copy of the power point we viewed  as a resource in the event they need reinforcement  of the concepts we discussed today in the office. The patient verbalized understanding of all of  the above and had no further questions upon leaving the office. They have my contact  information in the event they have any further questions.  I spent 4 minutes counseling on smoking cessation and the health risks of continued tobacco abuse, options for quitting and methods of behavioral adjustments to help with quitting.We discussed the options of smoking cessation classes and availability of free tobacco replacement through 1-800-Quit Now. ( Card provided to the patient. )  I explained to the patient that there has been a high incidence of coronary artery disease noted on these exams. I explained that this is a non-gated exam therefore degree or severity cannot be determined. This patient is on statin therapy. I have asked the patient to follow-up with their PCP regarding any incidental finding of coronary artery disease and management with diet or medication as their PCP  feels is clinically indicated. The patient verbalized understanding of the above and had no further questions upon completion of the visit.     Magdalen Spatz, NP 11/25/2020 9:51 AM

## 2020-11-25 NOTE — Patient Instructions (Signed)
Thank you for participating in the [place] Cancer Screening Program. It was our pleasure to meet you today. We will call you with the results of your scan within the next few days. Your scan will be assigned a Lung RADS category score by the physicians reading the scans.  This Lung RADS score determines follow up scanning.  See below for description of categories, and follow up screening recommendations. We will be in touch to schedule your follow up screening annually or based on recommendations of our providers. We will fax a copy of your scan results to your Primary Care Physician, or the physician who referred you to the program, to ensure they have the results. Please call the office if you have any questions or concerns regarding your scanning experience or results.  Our office number is [phone]. Please speak with Denise Phelps, RN. She is our Lung Cancer Screening RN. If she is unavailable when you call, please have the office staff send her a message. She will return your call at her earliest convenience. Remember, if your scan is normal, we will scan you annually as long as you continue to meet the criteria for the program. (Age 55-77, Current smoker or smoker who has quit within the last 15 years). If you are a smoker, remember, quitting is the single most powerful action that you can take to decrease your risk of lung cancer and other pulmonary, breathing related problems. We know quitting is hard, and we are here to help.  Please let us know if there is anything we can do to help you meet your goal of quitting. If you are a former smoker, congratulations. We are proud of you! Remain smoke free! Remember you can refer friends or family members through the number above.  We will screen them to make sure they meet criteria for the program. Thank you for helping us take better care of you by participating in Lung Screening.  Lung RADS Categories:  Lung RADS 1: no nodules  or definitely non-concerning nodules.  Recommendation is for a repeat annual scan in 12 months.  Lung RADS 2:  nodules that are non-concerning in appearance and behavior with a very low likelihood of becoming an active cancer. Recommendation is for a repeat annual scan in 12 months.  Lung RADS 3: nodules that are probably non-concerning , includes nodules with a low likelihood of becoming an active cancer.  Recommendation is for a 6-month repeat screening scan. Often noted after an upper respiratory illness. We will be in touch to make sure you have no questions, and to schedule your 6-month scan.  Lung RADS 4 A: nodules with concerning findings, recommendation is most often for a follow up scan in 3 months or additional testing based on our provider's assessment of the scan. We will be in touch to make sure you have no questions and to schedule the recommended 3 month follow up scan.  Lung RADS 4 B:  indicates findings that are concerning. We will be in touch with you to schedule additional diagnostic testing based on our provider's  assessment of the scan.   

## 2020-11-29 NOTE — Progress Notes (Signed)
Please call patient and let them  know their  low dose Ct was read as a Lung RADS 2: nodules that are benign in appearance and behavior with a very low likelihood of becoming a clinically active cancer due to size or lack of growth. Recommendation per radiology is for a repeat LDCT in 12 months. .Please let them  know we will order and schedule their  annual screening scan for 11/2021. Please let them  know there was notation of CAD on their  scan.  Please remind the patient  that this is a non-gated exam therefore degree or severity of disease  cannot be determined. Please have them  follow up with their PCP regarding potential risk factor modification, dietary therapy or pharmacologic therapy if clinically indicated. Pt.  is  currently on statin therapy. Please place order for annual  screening scan for  11/2021 and fax results to PCP. Thanks so much. 

## 2020-12-06 ENCOUNTER — Other Ambulatory Visit: Payer: Self-pay | Admitting: *Deleted

## 2020-12-06 DIAGNOSIS — Z87891 Personal history of nicotine dependence: Secondary | ICD-10-CM

## 2020-12-06 DIAGNOSIS — F17210 Nicotine dependence, cigarettes, uncomplicated: Secondary | ICD-10-CM

## 2021-07-03 ENCOUNTER — Other Ambulatory Visit: Payer: Self-pay | Admitting: Nephrology

## 2021-07-03 DIAGNOSIS — I10 Essential (primary) hypertension: Secondary | ICD-10-CM

## 2021-07-15 ENCOUNTER — Ambulatory Visit
Admission: RE | Admit: 2021-07-15 | Discharge: 2021-07-15 | Disposition: A | Discharge status: Home / Self Care | Payer: Medicare Other | Source: Ambulatory Visit | Attending: Nephrology | Admitting: Nephrology

## 2021-07-15 DIAGNOSIS — I10 Essential (primary) hypertension: Secondary | ICD-10-CM

## 2021-11-25 ENCOUNTER — Other Ambulatory Visit: Payer: Medicare Other

## 2021-11-25 ENCOUNTER — Ambulatory Visit
Admission: RE | Admit: 2021-11-25 | Discharge: 2021-11-25 | Disposition: A | Discharge status: Home / Self Care | Payer: Medicare Other | Source: Ambulatory Visit | Attending: Acute Care | Admitting: Acute Care

## 2021-11-25 DIAGNOSIS — Z87891 Personal history of nicotine dependence: Secondary | ICD-10-CM

## 2021-11-25 DIAGNOSIS — F17210 Nicotine dependence, cigarettes, uncomplicated: Secondary | ICD-10-CM

## 2021-11-27 ENCOUNTER — Other Ambulatory Visit: Payer: Self-pay

## 2021-11-27 DIAGNOSIS — Z87891 Personal history of nicotine dependence: Secondary | ICD-10-CM

## 2021-11-27 DIAGNOSIS — Z122 Encounter for screening for malignant neoplasm of respiratory organs: Secondary | ICD-10-CM

## 2021-11-27 DIAGNOSIS — F17210 Nicotine dependence, cigarettes, uncomplicated: Secondary | ICD-10-CM

## 2022-11-08 ENCOUNTER — Other Ambulatory Visit: Payer: Self-pay | Admitting: Acute Care

## 2022-11-08 DIAGNOSIS — Z87891 Personal history of nicotine dependence: Secondary | ICD-10-CM

## 2022-11-08 DIAGNOSIS — F17210 Nicotine dependence, cigarettes, uncomplicated: Secondary | ICD-10-CM

## 2022-11-08 DIAGNOSIS — Z122 Encounter for screening for malignant neoplasm of respiratory organs: Secondary | ICD-10-CM

## 2022-12-02 ENCOUNTER — Ambulatory Visit
Admission: RE | Admit: 2022-12-02 | Discharge: 2022-12-02 | Disposition: A | Discharge status: Home / Self Care | Payer: Medicare Other | Source: Ambulatory Visit | Attending: Acute Care | Admitting: Acute Care

## 2022-12-02 DIAGNOSIS — F17210 Nicotine dependence, cigarettes, uncomplicated: Secondary | ICD-10-CM

## 2022-12-02 DIAGNOSIS — Z122 Encounter for screening for malignant neoplasm of respiratory organs: Secondary | ICD-10-CM

## 2022-12-02 DIAGNOSIS — Z87891 Personal history of nicotine dependence: Secondary | ICD-10-CM

## 2022-12-08 ENCOUNTER — Encounter: Payer: Self-pay | Admitting: Acute Care

## 2022-12-08 ENCOUNTER — Other Ambulatory Visit: Payer: Self-pay | Admitting: Acute Care

## 2022-12-08 DIAGNOSIS — Z87891 Personal history of nicotine dependence: Secondary | ICD-10-CM

## 2022-12-08 DIAGNOSIS — F17210 Nicotine dependence, cigarettes, uncomplicated: Secondary | ICD-10-CM

## 2022-12-08 DIAGNOSIS — Z122 Encounter for screening for malignant neoplasm of respiratory organs: Secondary | ICD-10-CM

## 2023-07-21 DIAGNOSIS — I129 Hypertensive chronic kidney disease with stage 1 through stage 4 chronic kidney disease, or unspecified chronic kidney disease: Secondary | ICD-10-CM | POA: Diagnosis not present

## 2023-07-21 DIAGNOSIS — I872 Venous insufficiency (chronic) (peripheral): Secondary | ICD-10-CM | POA: Diagnosis not present

## 2023-07-21 DIAGNOSIS — I10 Essential (primary) hypertension: Secondary | ICD-10-CM | POA: Diagnosis not present

## 2023-07-21 DIAGNOSIS — N1832 Chronic kidney disease, stage 3b: Secondary | ICD-10-CM | POA: Diagnosis not present

## 2023-10-19 DIAGNOSIS — M109 Gout, unspecified: Secondary | ICD-10-CM | POA: Diagnosis not present

## 2023-10-19 DIAGNOSIS — L570 Actinic keratosis: Secondary | ICD-10-CM | POA: Diagnosis not present

## 2023-10-19 DIAGNOSIS — J449 Chronic obstructive pulmonary disease, unspecified: Secondary | ICD-10-CM | POA: Diagnosis not present

## 2023-10-19 DIAGNOSIS — L821 Other seborrheic keratosis: Secondary | ICD-10-CM | POA: Diagnosis not present

## 2023-10-19 DIAGNOSIS — L905 Scar conditions and fibrosis of skin: Secondary | ICD-10-CM | POA: Diagnosis not present

## 2023-10-19 DIAGNOSIS — D0461 Carcinoma in situ of skin of right upper limb, including shoulder: Secondary | ICD-10-CM | POA: Diagnosis not present

## 2023-10-19 DIAGNOSIS — Z0000 Encounter for general adult medical examination without abnormal findings: Secondary | ICD-10-CM | POA: Diagnosis not present

## 2023-10-19 DIAGNOSIS — E039 Hypothyroidism, unspecified: Secondary | ICD-10-CM | POA: Diagnosis not present

## 2023-10-19 DIAGNOSIS — Z85820 Personal history of malignant melanoma of skin: Secondary | ICD-10-CM | POA: Diagnosis not present

## 2023-10-19 DIAGNOSIS — Z125 Encounter for screening for malignant neoplasm of prostate: Secondary | ICD-10-CM | POA: Diagnosis not present

## 2023-10-19 DIAGNOSIS — D485 Neoplasm of uncertain behavior of skin: Secondary | ICD-10-CM | POA: Diagnosis not present

## 2023-10-19 DIAGNOSIS — Z1331 Encounter for screening for depression: Secondary | ICD-10-CM | POA: Diagnosis not present

## 2023-10-19 DIAGNOSIS — R7303 Prediabetes: Secondary | ICD-10-CM | POA: Diagnosis not present

## 2023-10-19 DIAGNOSIS — D7589 Other specified diseases of blood and blood-forming organs: Secondary | ICD-10-CM | POA: Diagnosis not present

## 2023-10-19 DIAGNOSIS — F17210 Nicotine dependence, cigarettes, uncomplicated: Secondary | ICD-10-CM | POA: Diagnosis not present

## 2023-10-19 DIAGNOSIS — E782 Mixed hyperlipidemia: Secondary | ICD-10-CM | POA: Diagnosis not present

## 2023-10-19 DIAGNOSIS — I10 Essential (primary) hypertension: Secondary | ICD-10-CM | POA: Diagnosis not present

## 2023-10-19 DIAGNOSIS — L814 Other melanin hyperpigmentation: Secondary | ICD-10-CM | POA: Diagnosis not present

## 2023-10-19 DIAGNOSIS — C44519 Basal cell carcinoma of skin of other part of trunk: Secondary | ICD-10-CM | POA: Diagnosis not present

## 2023-10-19 DIAGNOSIS — C44619 Basal cell carcinoma of skin of left upper limb, including shoulder: Secondary | ICD-10-CM | POA: Diagnosis not present

## 2023-10-19 DIAGNOSIS — Z85828 Personal history of other malignant neoplasm of skin: Secondary | ICD-10-CM | POA: Diagnosis not present

## 2023-10-19 DIAGNOSIS — E538 Deficiency of other specified B group vitamins: Secondary | ICD-10-CM | POA: Diagnosis not present

## 2023-11-15 DIAGNOSIS — D123 Benign neoplasm of transverse colon: Secondary | ICD-10-CM | POA: Diagnosis not present

## 2023-11-15 DIAGNOSIS — D124 Benign neoplasm of descending colon: Secondary | ICD-10-CM | POA: Diagnosis not present

## 2023-11-15 DIAGNOSIS — K648 Other hemorrhoids: Secondary | ICD-10-CM | POA: Diagnosis not present

## 2023-11-15 DIAGNOSIS — Z09 Encounter for follow-up examination after completed treatment for conditions other than malignant neoplasm: Secondary | ICD-10-CM | POA: Diagnosis not present

## 2023-11-15 DIAGNOSIS — K5730 Diverticulosis of large intestine without perforation or abscess without bleeding: Secondary | ICD-10-CM | POA: Diagnosis not present

## 2023-11-15 DIAGNOSIS — D125 Benign neoplasm of sigmoid colon: Secondary | ICD-10-CM | POA: Diagnosis not present

## 2023-11-15 DIAGNOSIS — Z860101 Personal history of adenomatous and serrated colon polyps: Secondary | ICD-10-CM | POA: Diagnosis not present

## 2023-11-17 DIAGNOSIS — D124 Benign neoplasm of descending colon: Secondary | ICD-10-CM | POA: Diagnosis not present

## 2023-11-17 DIAGNOSIS — D125 Benign neoplasm of sigmoid colon: Secondary | ICD-10-CM | POA: Diagnosis not present

## 2023-11-17 DIAGNOSIS — D123 Benign neoplasm of transverse colon: Secondary | ICD-10-CM | POA: Diagnosis not present

## 2023-11-30 DIAGNOSIS — H52223 Regular astigmatism, bilateral: Secondary | ICD-10-CM | POA: Diagnosis not present

## 2023-11-30 DIAGNOSIS — H524 Presbyopia: Secondary | ICD-10-CM | POA: Diagnosis not present

## 2023-12-09 ENCOUNTER — Other Ambulatory Visit: Payer: Self-pay | Admitting: *Deleted

## 2023-12-09 DIAGNOSIS — F17210 Nicotine dependence, cigarettes, uncomplicated: Secondary | ICD-10-CM

## 2023-12-09 DIAGNOSIS — Z87891 Personal history of nicotine dependence: Secondary | ICD-10-CM

## 2023-12-09 DIAGNOSIS — Z122 Encounter for screening for malignant neoplasm of respiratory organs: Secondary | ICD-10-CM

## 2023-12-22 ENCOUNTER — Ambulatory Visit
Admission: RE | Admit: 2023-12-22 | Discharge: 2023-12-22 | Disposition: A | Discharge status: Home / Self Care | Source: Ambulatory Visit | Attending: Acute Care | Admitting: Acute Care

## 2023-12-22 DIAGNOSIS — E039 Hypothyroidism, unspecified: Secondary | ICD-10-CM | POA: Diagnosis not present

## 2023-12-22 DIAGNOSIS — R7303 Prediabetes: Secondary | ICD-10-CM | POA: Diagnosis not present

## 2023-12-22 DIAGNOSIS — F17210 Nicotine dependence, cigarettes, uncomplicated: Secondary | ICD-10-CM | POA: Diagnosis not present

## 2023-12-22 DIAGNOSIS — R42 Dizziness and giddiness: Secondary | ICD-10-CM | POA: Diagnosis not present

## 2023-12-22 DIAGNOSIS — Z87891 Personal history of nicotine dependence: Secondary | ICD-10-CM

## 2023-12-22 DIAGNOSIS — M109 Gout, unspecified: Secondary | ICD-10-CM | POA: Diagnosis not present

## 2023-12-22 DIAGNOSIS — I10 Essential (primary) hypertension: Secondary | ICD-10-CM | POA: Diagnosis not present

## 2023-12-22 DIAGNOSIS — Z122 Encounter for screening for malignant neoplasm of respiratory organs: Secondary | ICD-10-CM

## 2023-12-22 DIAGNOSIS — E782 Mixed hyperlipidemia: Secondary | ICD-10-CM | POA: Diagnosis not present

## 2023-12-22 DIAGNOSIS — J449 Chronic obstructive pulmonary disease, unspecified: Secondary | ICD-10-CM | POA: Diagnosis not present

## 2024-01-05 ENCOUNTER — Other Ambulatory Visit: Payer: Self-pay

## 2024-01-05 DIAGNOSIS — Z87891 Personal history of nicotine dependence: Secondary | ICD-10-CM

## 2024-01-05 DIAGNOSIS — Z122 Encounter for screening for malignant neoplasm of respiratory organs: Secondary | ICD-10-CM

## 2024-01-05 DIAGNOSIS — F17210 Nicotine dependence, cigarettes, uncomplicated: Secondary | ICD-10-CM

## 2024-02-07 DIAGNOSIS — G4733 Obstructive sleep apnea (adult) (pediatric): Secondary | ICD-10-CM | POA: Diagnosis not present

## 2024-02-07 DIAGNOSIS — N1832 Chronic kidney disease, stage 3b: Secondary | ICD-10-CM | POA: Diagnosis not present

## 2024-02-07 DIAGNOSIS — D631 Anemia in chronic kidney disease: Secondary | ICD-10-CM | POA: Diagnosis not present

## 2024-02-07 DIAGNOSIS — Z720 Tobacco use: Secondary | ICD-10-CM | POA: Diagnosis not present

## 2024-02-07 DIAGNOSIS — I129 Hypertensive chronic kidney disease with stage 1 through stage 4 chronic kidney disease, or unspecified chronic kidney disease: Secondary | ICD-10-CM | POA: Diagnosis not present

## 2024-02-21 DIAGNOSIS — Z85820 Personal history of malignant melanoma of skin: Secondary | ICD-10-CM | POA: Diagnosis not present

## 2024-02-21 DIAGNOSIS — D485 Neoplasm of uncertain behavior of skin: Secondary | ICD-10-CM | POA: Diagnosis not present

## 2024-02-21 DIAGNOSIS — L905 Scar conditions and fibrosis of skin: Secondary | ICD-10-CM | POA: Diagnosis not present

## 2024-02-21 DIAGNOSIS — D225 Melanocytic nevi of trunk: Secondary | ICD-10-CM | POA: Diagnosis not present

## 2024-02-21 DIAGNOSIS — L821 Other seborrheic keratosis: Secondary | ICD-10-CM | POA: Diagnosis not present

## 2024-02-21 DIAGNOSIS — C44519 Basal cell carcinoma of skin of other part of trunk: Secondary | ICD-10-CM | POA: Diagnosis not present

## 2024-02-21 DIAGNOSIS — L570 Actinic keratosis: Secondary | ICD-10-CM | POA: Diagnosis not present

## 2024-02-21 DIAGNOSIS — C44719 Basal cell carcinoma of skin of left lower limb, including hip: Secondary | ICD-10-CM | POA: Diagnosis not present

## 2024-02-21 DIAGNOSIS — Z85828 Personal history of other malignant neoplasm of skin: Secondary | ICD-10-CM | POA: Diagnosis not present

## 2024-04-24 DIAGNOSIS — I10 Essential (primary) hypertension: Secondary | ICD-10-CM | POA: Diagnosis not present

## 2024-04-24 DIAGNOSIS — F17210 Nicotine dependence, cigarettes, uncomplicated: Secondary | ICD-10-CM | POA: Diagnosis not present

## 2024-04-24 DIAGNOSIS — E039 Hypothyroidism, unspecified: Secondary | ICD-10-CM | POA: Diagnosis not present

## 2024-04-24 DIAGNOSIS — E538 Deficiency of other specified B group vitamins: Secondary | ICD-10-CM | POA: Diagnosis not present

## 2024-04-24 DIAGNOSIS — N2581 Secondary hyperparathyroidism of renal origin: Secondary | ICD-10-CM | POA: Diagnosis not present

## 2024-04-24 DIAGNOSIS — R7303 Prediabetes: Secondary | ICD-10-CM | POA: Diagnosis not present

## 2024-04-24 DIAGNOSIS — M109 Gout, unspecified: Secondary | ICD-10-CM | POA: Diagnosis not present

## 2024-04-24 DIAGNOSIS — E782 Mixed hyperlipidemia: Secondary | ICD-10-CM | POA: Diagnosis not present

## 2024-04-24 DIAGNOSIS — J449 Chronic obstructive pulmonary disease, unspecified: Secondary | ICD-10-CM | POA: Diagnosis not present

## 2024-04-24 DIAGNOSIS — I739 Peripheral vascular disease, unspecified: Secondary | ICD-10-CM | POA: Diagnosis not present

## 2024-04-24 DIAGNOSIS — D7589 Other specified diseases of blood and blood-forming organs: Secondary | ICD-10-CM | POA: Diagnosis not present

## 2024-04-24 DIAGNOSIS — N184 Chronic kidney disease, stage 4 (severe): Secondary | ICD-10-CM | POA: Diagnosis not present

## 2024-05-24 DIAGNOSIS — Z85828 Personal history of other malignant neoplasm of skin: Secondary | ICD-10-CM | POA: Diagnosis not present

## 2024-05-24 DIAGNOSIS — D485 Neoplasm of uncertain behavior of skin: Secondary | ICD-10-CM | POA: Diagnosis not present

## 2024-05-24 DIAGNOSIS — C44629 Squamous cell carcinoma of skin of left upper limb, including shoulder: Secondary | ICD-10-CM | POA: Diagnosis not present

## 2024-05-24 DIAGNOSIS — Z85820 Personal history of malignant melanoma of skin: Secondary | ICD-10-CM | POA: Diagnosis not present
